# Patient Record
Sex: Male | Born: 1965 | Race: Black or African American | Hispanic: No | Marital: Married | Smoking: Never smoker
Health system: Southern US, Community
[De-identification: ages and names within clinical notes are randomized; demographics above are authoritative.]

## PROBLEM LIST (undated history)

## (undated) DIAGNOSIS — E0500 Thyrotoxicosis with diffuse goiter without thyrotoxic crisis or storm: Secondary | ICD-10-CM

## (undated) DIAGNOSIS — E039 Hypothyroidism, unspecified: Secondary | ICD-10-CM

## (undated) DIAGNOSIS — I10 Essential (primary) hypertension: Secondary | ICD-10-CM

## (undated) DIAGNOSIS — J939 Pneumothorax, unspecified: Secondary | ICD-10-CM

## (undated) DIAGNOSIS — E785 Hyperlipidemia, unspecified: Secondary | ICD-10-CM

## (undated) HISTORY — DX: Hypothyroidism, unspecified: E03.9

## (undated) HISTORY — DX: Essential (primary) hypertension: I10

## (undated) HISTORY — DX: Pneumothorax, unspecified: J93.9

## (undated) HISTORY — DX: Thyrotoxicosis with diffuse goiter without thyrotoxic crisis or storm: E05.00

## (undated) HISTORY — PX: COLONOSCOPY WITH PROPOFOL: SHX5780

## (undated) HISTORY — DX: Hyperlipidemia, unspecified: E78.5

---

## 1995-07-02 DIAGNOSIS — J939 Pneumothorax, unspecified: Secondary | ICD-10-CM | POA: Insufficient documentation

## 1995-07-02 HISTORY — DX: Pneumothorax, unspecified: J93.9

## 2005-06-06 ENCOUNTER — Emergency Department: Payer: Self-pay | Admitting: Emergency Medicine

## 2005-06-25 ENCOUNTER — Emergency Department: Payer: Self-pay | Admitting: Emergency Medicine

## 2006-11-08 ENCOUNTER — Other Ambulatory Visit: Payer: Self-pay

## 2006-11-09 ENCOUNTER — Inpatient Hospital Stay: Payer: Self-pay | Admitting: Internal Medicine

## 2008-01-04 ENCOUNTER — Emergency Department: Payer: Self-pay | Admitting: Emergency Medicine

## 2011-01-27 ENCOUNTER — Emergency Department: Payer: Self-pay | Admitting: Emergency Medicine

## 2013-03-05 LAB — HM HEPATITIS C SCREENING LAB: HM Hepatitis Screen: NEGATIVE

## 2015-02-06 ENCOUNTER — Other Ambulatory Visit: Payer: Self-pay | Admitting: Family Medicine

## 2015-02-06 DIAGNOSIS — E039 Hypothyroidism, unspecified: Secondary | ICD-10-CM

## 2015-03-02 ENCOUNTER — Other Ambulatory Visit: Payer: Self-pay | Admitting: Family Medicine

## 2015-07-14 ENCOUNTER — Other Ambulatory Visit: Payer: Self-pay | Admitting: Family Medicine

## 2015-08-14 ENCOUNTER — Other Ambulatory Visit: Payer: Self-pay | Admitting: Family Medicine

## 2015-09-12 ENCOUNTER — Other Ambulatory Visit: Payer: Self-pay | Admitting: Family Medicine

## 2015-09-29 ENCOUNTER — Other Ambulatory Visit: Payer: Self-pay | Admitting: Family Medicine

## 2015-12-27 ENCOUNTER — Other Ambulatory Visit: Payer: Self-pay | Admitting: Family Medicine

## 2016-01-18 ENCOUNTER — Other Ambulatory Visit: Payer: Self-pay | Admitting: Family Medicine

## 2016-04-01 ENCOUNTER — Other Ambulatory Visit: Payer: Self-pay | Admitting: Family Medicine

## 2016-05-24 ENCOUNTER — Other Ambulatory Visit: Payer: Self-pay | Admitting: Family Medicine

## 2016-06-25 ENCOUNTER — Other Ambulatory Visit: Payer: Self-pay

## 2016-06-25 MED ORDER — LEVOTHYROXINE SODIUM 150 MCG PO TABS: 150.0000 ug | ORAL_TABLET | Freq: Every day | ORAL | 1 refills | Status: DC

## 2016-06-25 NOTE — Telephone Encounter (Signed)
Pharmacy is requesting permission to change manufacture. Please review. [employer] employee. Thank you. sd

## 2016-10-05 ENCOUNTER — Other Ambulatory Visit: Payer: Self-pay | Admitting: Family Medicine

## 2016-10-07 ENCOUNTER — Other Ambulatory Visit: Payer: Self-pay

## 2016-10-07 NOTE — Telephone Encounter (Signed)
[facility] pharmacy is requesting a refill on lisinopril (PRINIVIL,ZESTRIL) 10 MG tablet

## 2017-03-19 ENCOUNTER — Other Ambulatory Visit: Payer: Self-pay | Admitting: Family Medicine

## 2017-06-05 ENCOUNTER — Encounter: Payer: Self-pay | Admitting: Family Medicine

## 2017-11-03 DIAGNOSIS — E039 Hypothyroidism, unspecified: Secondary | ICD-10-CM

## 2017-11-03 DIAGNOSIS — E890 Postprocedural hypothyroidism: Secondary | ICD-10-CM | POA: Insufficient documentation

## 2017-11-03 DIAGNOSIS — E0500 Thyrotoxicosis with diffuse goiter without thyrotoxic crisis or storm: Secondary | ICD-10-CM | POA: Insufficient documentation

## 2017-11-03 DIAGNOSIS — E785 Hyperlipidemia, unspecified: Secondary | ICD-10-CM | POA: Insufficient documentation

## 2017-11-03 DIAGNOSIS — I10 Essential (primary) hypertension: Secondary | ICD-10-CM | POA: Insufficient documentation

## 2017-11-03 HISTORY — DX: Essential (primary) hypertension: I10

## 2017-11-03 HISTORY — DX: Hypothyroidism, unspecified: E03.9

## 2017-11-03 HISTORY — DX: Hyperlipidemia, unspecified: E78.5

## 2017-11-03 HISTORY — DX: Thyrotoxicosis with diffuse goiter without thyrotoxic crisis or storm: E05.00

## 2017-11-04 ENCOUNTER — Ambulatory Visit (INDEPENDENT_AMBULATORY_CARE_PROVIDER_SITE_OTHER): Payer: No Typology Code available for payment source | Admitting: Family Medicine

## 2017-11-04 ENCOUNTER — Encounter: Payer: Self-pay | Admitting: Family Medicine

## 2017-11-04 VITALS — BP 132/88 | HR 74 | Temp 98.4°F | Ht 73.0 in | Wt 229.6 lb

## 2017-11-04 DIAGNOSIS — Z0000 Encounter for general adult medical examination without abnormal findings: Secondary | ICD-10-CM

## 2017-11-04 DIAGNOSIS — I10 Essential (primary) hypertension: Secondary | ICD-10-CM | POA: Diagnosis not present

## 2017-11-04 DIAGNOSIS — Z114 Encounter for screening for human immunodeficiency virus [HIV]: Secondary | ICD-10-CM

## 2017-11-04 DIAGNOSIS — Z125 Encounter for screening for malignant neoplasm of prostate: Secondary | ICD-10-CM

## 2017-11-04 DIAGNOSIS — Z5320 Procedure and treatment not carried out because of patient's decision for unspecified reasons: Secondary | ICD-10-CM | POA: Diagnosis not present

## 2017-11-04 DIAGNOSIS — E039 Hypothyroidism, unspecified: Secondary | ICD-10-CM

## 2017-11-04 DIAGNOSIS — E782 Mixed hyperlipidemia: Secondary | ICD-10-CM

## 2017-11-04 LAB — HEMOCCULT GUIAC POC 1CARD (OFFICE): FECAL OCCULT BLD: NEGATIVE

## 2017-11-04 NOTE — Progress Notes (Signed)
Patient: Scott Holloway, Male    DOB: 1966-05-28, 52 y.o.   MRN: [id number] Visit Date: 11/04/2017  Today's Provider: Dortha Kern, PA   Chief Complaint  Patient presents with  . Annual Exam   Subjective:    Annual physical exam Scott Holloway is a 52 y.o. male who presents today for health maintenance and complete physical. He feels well. He reports exercising 3 days per week. He reports he is sleeping fair. He works 3rd shift.  -----------------------------------------------------------------   Review of Systems  Constitutional: Negative.   HENT: Negative.   Eyes: Negative.   Respiratory: Negative.   Cardiovascular: Negative.   Gastrointestinal: Negative.   Endocrine: Negative.   Genitourinary: Negative.   Musculoskeletal: Negative.   Skin: Negative.   Allergic/Immunologic: Negative.   Neurological: Negative.   Hematological: Negative.   Psychiatric/Behavioral: Negative.    Social History      He  reports that he has never smoked. He has never used smokeless tobacco. He reports that he does not drink alcohol or use drugs.       Social History   Socioeconomic History  . Marital status: Married    Spouse name: Not on file  . Number of children: Not on file  . Years of education: Not on file  . Highest education level: Not on file  Occupational History  . Not on file  Social Needs  . Financial resource strain: Not on file  . Food insecurity:    Worry: Not on file    Inability: Not on file  . Transportation needs:    Medical: Not on file    Non-medical: Not on file  Tobacco Use  . Smoking status: Never Smoker  . Smokeless tobacco: Never Used  Substance and Sexual Activity  . Alcohol use: Never    Frequency: Never  . Drug use: Never  . Sexual activity: Yes  Lifestyle  . Physical activity:    Days per week: Not on file    Minutes per session: Not on file  . Stress: Not on file  Relationships  . Social connections:    Talks on phone: Not on  file    Gets together: Not on file    Attends religious service: Not on file    Active member of club or organization: Not on file    Attends meetings of clubs or organizations: Not on file    Relationship status: Not on file  Other Topics Concern  . Not on file  Social History Narrative  . Not on file    Past Medical History:  Diagnosis Date  . Graves disease 11/03/2017  . Hyperlipidemia 11/03/2017  . Hypertension 11/03/2017  . Hypothyroidism 11/03/2017  . Pneumothorax 1997   Patient Active Problem List   Diagnosis Date Noted  . Hyperlipidemia 11/03/2017  . Hypertension 11/03/2017  . Hypothyroidism (acquired) 11/03/2017  . Graves disease 11/03/2017  . Pneumothorax 07/02/1995   History reviewed. No pertinent surgical history.  Family History        Family Status  Relation Name Status  . Mother  Alive  . Father  Deceased  . Sister  Alive  . Brother  Alive       step brother  . Son  Alive  . Daughter  Alive  . MGM  Deceased  . MGF  Deceased  . PGM  Deceased  . PGF  Deceased       accidental death-froze to death  His family history includes Alzheimer's disease in his paternal grandmother; Aneurysm in his maternal grandfather and mother; Healthy in his daughter, sister, and son; Hypertension in his brother, maternal grandmother, and mother; Lung cancer in his father.     No Known Allergies  Current Outpatient Medications:  .  levothyroxine (SYNTHROID, LEVOTHROID) 150 MCG tablet, TAKE ONE TABLET BY MOUTH ONCE DAILY, Disp: 90 tablet, Rfl: 1 .  lisinopril (PRINIVIL,ZESTRIL) 10 MG tablet, TAKE 1 TABLET BY MOUTH ONCE DAILY, Disp: 90 tablet, Rfl: 1 .  Omega-3 Fatty Acids (FISH OIL) 1000 MG CAPS, Take by mouth., Disp: , Rfl:  .  Red Yeast Rice Extract (RED YEAST RICE PO), Take by mouth., Disp: , Rfl:    Patient Care Team: Aadya Kindler, Jodell Cipro, PA as PCP - General (Family Medicine)      Objective:   Vitals: BP 132/88 (BP Location: Left Arm, Patient Position: Sitting,  Cuff Size: Normal)   Pulse 74   Temp 98.4 F (36.9 C) (Oral)   Ht  (1.854 m)   Wt 229 lb 9.6 oz (104.1 kg)   SpO2 98%   BMI 30.29 kg/m   Physical Exam  Constitutional: He is oriented to person, place, and time. He appears well-developed and well-nourished.  HENT:  Head: Normocephalic and atraumatic.  Right Ear: External ear normal.  Left Ear: External ear normal.  Nose: Nose normal.  Mouth/Throat: Oropharynx is clear and moist.  Eyes: Pupils are equal, round, and reactive to light. Conjunctivae and EOM are normal. Right eye exhibits no discharge.  Neck: Normal range of motion. Neck supple. No tracheal deviation present. No thyromegaly present.  Cardiovascular: Normal rate, regular rhythm, normal heart sounds and intact distal pulses.  No murmur heard. Pulmonary/Chest: Effort normal and breath sounds normal. No respiratory distress. He has no wheezes. He has no rales. He exhibits no tenderness.  Abdominal: Soft. He exhibits no distension and no mass. There is no tenderness. There is no rebound and no guarding.  Genitourinary: Rectum normal, prostate normal and penis normal. Rectal exam shows guaiac negative stool.  Genitourinary Comments: Normal uncircumcised male.  Musculoskeletal: Normal range of motion. He exhibits no edema or tenderness.  Lymphadenopathy:    He has no cervical adenopathy.  Neurological: He is alert and oriented to person, place, and time. He has normal reflexes. He displays normal reflexes. No cranial nerve deficit. He exhibits normal muscle tone. Coordination normal.  Skin: Skin is warm and dry. No rash noted. No erythema.  < 1 cm lipomas - one on the right forearm and one on the lower abdomen to the right of the umbilicus.  Psychiatric: He has a normal mood and affect. His behavior is normal. Judgment and thought content normal.    Depression Screen PHQ 2/9 Scores 11/04/2017  PHQ - 2 Score 0  PHQ- 9 Score 1    Assessment & Plan:     Routine Health  Maintenance and Physical Exam  Exercise Activities and Dietary recommendations Goals    None       There is no immunization history on file for this patient.  Health Maintenance  Topic Date Due  . HIV Screening  08/28/1978  . TETANUS/TDAP  08/28/1982  . COLONOSCOPY  08/28/2013  . INFLUENZA VACCINE  01/29/2018  . Hepatitis C Screening  Completed    Discussed health benefits of physical activity, and encouraged him to engage in regular exercise appropriate for his age and condition.    --------------------------------------------------------------------  1. Annual physical exam Good  general health. Has changed to a 3 shift job the past 3-4 months and trying to adjust. Last colonoscopy by Dr. Thurmond Butts was more than 13 years ago. Denies hematochezia, melena or history of polyps. Refuses repeat colonoscopy - hemoccult negative for blood in stools. Will get record of last tetanus given 2-3 years ago at [facility]. Given anticipatory counseling and will get routine labs. Follow up pending reports. - CBC with Differential - Lipid Profile - TSH - Comprehensive metabolic panel - PSA  2. Essential hypertension Fair control with Lisinopril 10 mg qd. No chest pains, palpitations, dyspnea or edema. Recheck labs and continue present dosage. - CBC with Differential - Lipid Profile - Comprehensive metabolic panel  3. Hypothyroidism (acquired) History of Grave's Disease without exophthalmos, tremors or palpitations. Still taking the Levothyroxine 150 mcg qd. Recheck CBC, T4 and TSH. Follow up pending reports. - CBC with Differential - TSH - T4  4. Mixed hyperlipidemia Still taking the Omega-3 and Red Yeast Rice Extract. Recheck lipid panel and CMP. - Omega-3 Fatty Acids (FISH OIL) 1000 MG CAPS; Take by mouth. - Red Yeast Rice Extract (RED YEAST RICE PO); Take by mouth. - Lipid Profile - Comprehensive metabolic panel  5. Screening for HIV (human immunodeficiency virus) - HIV  antibody  6. Screening PSA (prostate specific antigen) No nocturia. Some dribbling after urinating. No significant decrease in stream or hesitancy. Will check PSA. DRE essentially normal. - PSA   Dortha Kern, PA  [facility]

## 2017-11-05 LAB — TSH: TSH: 3.12 u[IU]/mL (ref 0.450–4.500)

## 2017-11-05 LAB — LIPID PANEL
CHOL/HDL RATIO: 5.9 ratio — AB (ref 0.0–5.0)
Cholesterol, Total: 291 mg/dL — ABNORMAL HIGH (ref 100–199)
HDL: 49 mg/dL (ref >39)
LDL Calculated: 215 mg/dL — ABNORMAL HIGH (ref 0–99)
Triglycerides: 134 mg/dL (ref 0–149)
VLDL Cholesterol Cal: 27 mg/dL (ref 5–40)

## 2017-11-05 LAB — COMPREHENSIVE METABOLIC PANEL
ALK PHOS: 54 IU/L (ref 39–117)
ALT: 13 IU/L (ref 0–44)
AST: 21 IU/L (ref 0–40)
Albumin/Globulin Ratio: 1.4 (ref 1.2–2.2)
Albumin: 4 g/dL (ref 3.5–5.5)
BUN/Creatinine Ratio: 10 (ref 9–20)
BUN: 14 mg/dL (ref 6–24)
Bilirubin Total: 0.2 mg/dL (ref 0.0–1.2)
CALCIUM: 9.2 mg/dL (ref 8.7–10.2)
CO2: 24 mmol/L (ref 20–29)
CREATININE: 1.34 mg/dL — AB (ref 0.76–1.27)
Chloride: 103 mmol/L (ref 96–106)
GFR calc Af Amer: 69 mL/min/{1.73_m2} (ref >59)
GFR calc non Af Amer: 60 mL/min/{1.73_m2} (ref >59)
Globulin, Total: 2.9 g/dL (ref 1.5–4.5)
Glucose: 92 mg/dL (ref 65–99)
Potassium: 4.1 mmol/L (ref 3.5–5.2)
SODIUM: 142 mmol/L (ref 134–144)
Total Protein: 6.9 g/dL (ref 6.0–8.5)

## 2017-11-05 LAB — CBC WITH DIFFERENTIAL/PLATELET
BASOS ABS: 0 10*3/uL (ref 0.0–0.2)
BASOS: 0 %
EOS (ABSOLUTE): 0.2 10*3/uL (ref 0.0–0.4)
Eos: 2 %
Hematocrit: 40.3 % (ref 37.5–51.0)
Hemoglobin: 13.2 g/dL (ref 13.0–17.7)
Immature Grans (Abs): 0 10*3/uL (ref 0.0–0.1)
Immature Granulocytes: 0 %
LYMPHS: 33 %
Lymphocytes Absolute: 2.6 10*3/uL (ref 0.7–3.1)
MCH: 28.9 pg (ref 26.6–33.0)
MCHC: 32.8 g/dL (ref 31.5–35.7)
MCV: 88 fL (ref 79–97)
MONOS ABS: 0.6 10*3/uL (ref 0.1–0.9)
Monocytes: 7 %
NEUTROS ABS: 4.5 10*3/uL (ref 1.4–7.0)
NEUTROS PCT: 58 %
PLATELETS: 276 10*3/uL (ref 150–379)
RBC: 4.56 x10E6/uL (ref 4.14–5.80)
RDW: 13.9 % (ref 12.3–15.4)
WBC: 7.9 10*3/uL (ref 3.4–10.8)

## 2017-11-05 LAB — HIV ANTIBODY (ROUTINE TESTING W REFLEX): HIV SCREEN 4TH GENERATION: NONREACTIVE

## 2017-11-05 LAB — T4: T4 TOTAL: 9.5 ug/dL (ref 4.5–12.0)

## 2017-11-05 LAB — PSA: PROSTATE SPECIFIC AG, SERUM: 9.3 ng/mL — AB (ref 0.0–4.0)

## 2017-11-06 ENCOUNTER — Telehealth: Payer: Self-pay | Admitting: Family Medicine

## 2017-11-06 ENCOUNTER — Telehealth: Payer: Self-pay

## 2017-11-06 DIAGNOSIS — R972 Elevated prostate specific antigen [PSA]: Secondary | ICD-10-CM

## 2017-11-06 DIAGNOSIS — E782 Mixed hyperlipidemia: Secondary | ICD-10-CM

## 2017-11-06 MED ORDER — PRAVASTATIN SODIUM 20 MG PO TABS: 20.0000 mg | ORAL_TABLET | Freq: Every day | ORAL | 3 refills | Status: DC

## 2017-11-06 NOTE — Telephone Encounter (Signed)
Please review. Thanks!  

## 2017-11-06 NOTE — Telephone Encounter (Signed)
Called patient and discussed adding Pravastatin while discontinuing the Red Yeast Rice. Also, will continue Omega-3 supplement and counseled regarding need for urology referral with PSA more than twice the upper limits of normal. Already had an appointment with an urologist.

## 2017-11-06 NOTE — Telephone Encounter (Signed)
Not the Red Yeast Rice, but, continue the Omega-3.

## 2017-11-06 NOTE — Telephone Encounter (Signed)
Patient advised. RX sent to [facility]. Urology referral placed. 3 month follow up scheduled.

## 2017-11-06 NOTE — Telephone Encounter (Signed)
Pt wants to know if you want him to continue taking the red yeast rice and omega 3.Please advise

## 2017-11-06 NOTE — Telephone Encounter (Signed)
Pt spoke with Nicki earlier today about results.  He has a couple other questions.  One is he wants to know since Maurine Minister put his on chol. meds does he want him to continue taking the Red Yeast Rice Supplement.  He also ask about referral to urology and exactly why he is going.  His call back [phone]  Thanks teri

## 2017-11-06 NOTE — Telephone Encounter (Signed)
-----   Message from Tamsen Roers, [place] sent at 11/06/2017  9:17 AM EDT ----- LDL cholesterol high with risk ration elevated. Needs Pravastatin 20 mg qd #30 & 3RF with follow up appointment in 3 months to check progress. Increase in creatinine indicates kidney strain with PSA twice the upper limits of normal. Need referral to urologist to evaluate and rule out prostate disease/cancer.

## 2017-12-15 ENCOUNTER — Encounter: Payer: Self-pay | Admitting: Urology

## 2017-12-15 ENCOUNTER — Ambulatory Visit (INDEPENDENT_AMBULATORY_CARE_PROVIDER_SITE_OTHER): Payer: No Typology Code available for payment source | Admitting: Urology

## 2017-12-15 VITALS — BP 122/80 | HR 69 | Resp 16 | Ht 72.0 in | Wt 229.2 lb

## 2017-12-15 DIAGNOSIS — R972 Elevated prostate specific antigen [PSA]: Secondary | ICD-10-CM | POA: Diagnosis not present

## 2017-12-15 MED ORDER — TAMSULOSIN HCL 0.4 MG PO CAPS: 0.4000 mg | ORAL_CAPSULE | Freq: Every day | ORAL | 0 refills | Status: DC

## 2017-12-15 NOTE — Progress Notes (Signed)
12/15/2017 3:01 PM   Scott Holloway February 06, 1966 [id number]  Referring provider: Tamsen Holloway, Scott E, PA [address] [place], [place] [id number]  Chief Complaint  Patient presents with  . Elevated PSA    HPI: Scott Holloway is a 52 year old male seen in consultation at the request of Scott Holloway for evaluation of an elevated PSA.  A PSA drawn 11/04/2017 was elevated at 9.3.  He states he thinks his PSA was checked annually for several years when he worked at [employer] and he thinks it was always normal.  He denies previous urologic evaluation for an elevated PSA.  There is no history of prostate cancer in first-degree relatives.  His only lower urinary tract symptoms occasional post void dribbling.  He denies dysuria or gross hematuria.  He denies flank, abdominal, pelvic or scrotal pain.   PMH: Past Medical History:  Diagnosis Date  . Graves disease 11/03/2017  . Hyperlipidemia 11/03/2017  . Hypertension 11/03/2017  . Hypothyroidism 11/03/2017  . Pneumothorax 1997    Surgical History: History reviewed. No pertinent surgical history.  Home Medications:  Allergies as of 12/15/2017   No Known Allergies     Medication List        Accurate as of 12/15/17  3:01 PM. Always use your most recent med list.          Fish Oil 1000 MG Caps Take by mouth.   levothyroxine 150 MCG tablet Commonly known as:  SYNTHROID, LEVOTHROID TAKE ONE TABLET BY MOUTH ONCE DAILY   lisinopril 10 MG tablet Commonly known as:  PRINIVIL,ZESTRIL TAKE 1 TABLET BY MOUTH ONCE DAILY   pravastatin 20 MG tablet Commonly known as:  PRAVACHOL Take 1 tablet (20 mg total) by mouth daily.   RED YEAST RICE PO Take by mouth.       Allergies: No Known Allergies  Family History: Family History  Problem Relation Age of Onset  . Hypertension Mother   . Aneurysm Mother   . Heart attack Mother   . Lung cancer Father   . Healthy Sister   . Hypertension Brother   . Healthy Son   . Healthy Daughter    . Hypertension Maternal Grandmother   . Aneurysm Maternal Grandfather   . Alzheimer's disease Paternal Grandmother     Social History:  reports that he has never smoked. He has never used smokeless tobacco. He reports that he does not drink alcohol or use drugs.  ROS: UROLOGY Frequent Urination?: Yes Hard to postpone urination?: No Burning/pain with urination?: No Get up at night to urinate?: Yes Leakage of urine?: Yes Urine stream starts and stops?: No Trouble starting stream?: No Do you have to strain to urinate?: No Blood in urine?: No Urinary tract infection?: No Sexually transmitted disease?: No Injury to kidneys or bladder?: No Painful intercourse?: No Weak stream?: No Erection problems?: Yes Penile pain?: No  Gastrointestinal Nausea?: No Vomiting?: No Indigestion/heartburn?: No Diarrhea?: No Constipation?: No  Constitutional Fever: No Night sweats?: No Weight loss?: No Fatigue?: No  Skin Skin rash/lesions?: No Itching?: No  Eyes Blurred vision?: No Double vision?: No  Ears/Nose/Throat Sore throat?: No Sinus problems?: Yes  Hematologic/Lymphatic Swollen glands?: No Easy bruising?: No  Cardiovascular Leg swelling?: No Chest pain?: No  Respiratory Cough?: Yes Shortness of breath?: No  Endocrine Excessive thirst?: No  Musculoskeletal Back pain?: Yes Joint pain?: Yes  Neurological Headaches?: No Dizziness?: No  Psychologic Depression?: No Anxiety?: No  Physical Exam: BP 122/80   Pulse 69  Resp 16   Ht 6' (1.829 m)   Wt 229 lb 3.2 oz (104 kg)   SpO2 96%   BMI 31.09 kg/m   Constitutional:  Alert and oriented, No acute distress. HEENT: [place] AT, moist mucus membranes.  Trachea midline, no masses. Cardiovascular: No clubbing, cyanosis, or edema. Respiratory: Normal respiratory effort, no increased work of breathing. GI: Abdomen is soft, nontender, nondistended, no abdominal masses GU: No CVA tenderness.  Prostate 40 g, smooth  without nodules.  He has significant discomfort in the anal canal on DRE. Lymph: No cervical or inguinal lymphadenopathy. Skin: No rashes, bruises or suspicious lesions. Neurologic: Grossly intact, no focal deficits, moving all 4 extremities. Psychiatric: Normal mood and affect.   Assessment & Plan:   52 year old male with an elevated PSA and benign DRE. Although PSA is a prostate cancer screening test he was informed that cancer is not the most common cause of an elevated PSA. Other potential causes including BPH and inflammation were discussed. He was informed that the only way to adequately diagnose prostate cancer would be a transrectal ultrasound and biopsy of the prostate. The procedure was discussed including potential risks of bleeding and infection/sepsis. He was also informed that a negative biopsy does not conclusively rule out the possibility that prostate cancer may be present and that continued monitoring is required. The use of newer adjunctive blood tests including PHI and 4kScore were discussed. The use of multiparametric prostate MRI was also discussed however is not typically used for initial evaluation of an elevated PSA. Continued periodic surveillance was also discussed.  He has not had a repeat PSA and have recommended a follow-up PSA in approximately 1 month after a 30-day alpha-blocker course.  He will be notified with the results and further recommendations.   [name], MD  [facility] [address], [address] [place], [place] [address] [phone]

## 2018-01-06 ENCOUNTER — Other Ambulatory Visit: Payer: Self-pay | Admitting: Family Medicine

## 2018-01-06 MED ORDER — LEVOTHYROXINE SODIUM 150 MCG PO TABS: 150.0000 ug | ORAL_TABLET | Freq: Every day | ORAL | 1 refills | Status: DC

## 2018-01-06 MED ORDER — LISINOPRIL 10 MG PO TABS: 10.0000 mg | ORAL_TABLET | Freq: Every day | ORAL | 1 refills | Status: DC

## 2018-01-06 NOTE — Telephone Encounter (Signed)
[facility] pharmacy faxed a refill request for the following medication. Thanks CC  levothyroxine (SYNTHROID, LEVOTHROID) 150 MCG tablet   lisinopril (PRINIVIL,ZESTRIL) 10 MG tablet

## 2018-01-14 ENCOUNTER — Other Ambulatory Visit: Payer: No Typology Code available for payment source

## 2018-01-14 DIAGNOSIS — R972 Elevated prostate specific antigen [PSA]: Secondary | ICD-10-CM

## 2018-01-15 LAB — PSA: Prostate Specific Ag, Serum: 2.5 ng/mL (ref 0.0–4.0)

## 2018-01-16 ENCOUNTER — Telehealth: Payer: Self-pay | Admitting: Urology

## 2018-01-16 NOTE — Telephone Encounter (Signed)
Pt called asking about lab results.  Please call pt

## 2018-01-19 ENCOUNTER — Telehealth: Payer: Self-pay | Admitting: Urology

## 2018-01-19 NOTE — Telephone Encounter (Signed)
Pt informed

## 2018-01-19 NOTE — Telephone Encounter (Signed)
-----   Message from Riki AltesScott C Stoioff, MD sent at 01/17/2018  1:43 PM EDT ----- Repeat PSA normal at 2.5.  Recommend continuing annual PSA with his primary provider

## 2018-02-06 ENCOUNTER — Ambulatory Visit: Payer: No Typology Code available for payment source | Admitting: Family Medicine

## 2018-03-03 ENCOUNTER — Encounter: Payer: Self-pay | Admitting: Family Medicine

## 2018-03-03 ENCOUNTER — Ambulatory Visit (INDEPENDENT_AMBULATORY_CARE_PROVIDER_SITE_OTHER): Payer: No Typology Code available for payment source | Admitting: Family Medicine

## 2018-03-03 VITALS — BP 138/86 | HR 78 | Temp 99.0°F | Wt 230.0 lb

## 2018-03-03 DIAGNOSIS — I10 Essential (primary) hypertension: Secondary | ICD-10-CM

## 2018-03-03 DIAGNOSIS — E782 Mixed hyperlipidemia: Secondary | ICD-10-CM

## 2018-03-03 MED ORDER — PRAVASTATIN SODIUM 20 MG PO TABS: 20.0000 mg | ORAL_TABLET | Freq: Every day | ORAL | 3 refills | Status: DC

## 2018-03-03 NOTE — Progress Notes (Signed)
Patient: Scott Holloway Male    DOB: 1966-04-07   52 y.o.   MRN: [id number] Visit Date: 03/03/2018  Today's Provider: Dortha Kern, PA   Chief Complaint  Patient presents with  . Hyperlipidemia   Subjective:      Hyperlipidemia  The problem is uncontrolled. Recent lipid tests were reviewed and are high. Current antihyperlipidemic treatment includes statins and herbal therapy. The current treatment provides moderate improvement of lipids. There are no compliance problems.    Pt reports he had cholesterol rechecked about a month ago at work.  He states his cholesterol has improved on the medicine.    Lab Results  Component Value Date   CHOL 291 (H) 11/04/2017   Lab Results  Component Value Date   HDL 49 11/04/2017   Lab Results  Component Value Date   LDLCALC 215 (H) 11/04/2017   Lab Results  Component Value Date   TRIG 134 11/04/2017   Lab Results  Component Value Date   CHOLHDL 5.9 (H) 11/04/2017   No results found for: LDLDIRECT Wt Readings from Last 3 Encounters:  03/03/18 230 lb (104.3 kg)  12/15/17 229 lb 3.2 oz (104 kg)  11/04/17 229 lb 9.6 oz (104.1 kg)      Past Medical History:  Diagnosis Date  . Graves disease 11/03/2017  . Hyperlipidemia 11/03/2017  . Hypertension 11/03/2017  . Hypothyroidism 11/03/2017  . Pneumothorax 1997   No past surgical history on file. Family History  Problem Relation Age of Onset  . Hypertension Mother   . Aneurysm Mother   . Heart attack Mother   . Lung cancer Father   . Healthy Sister   . Hypertension Brother   . Healthy Son   . Healthy Daughter   . Hypertension Maternal Grandmother   . Aneurysm Maternal Grandfather   . Alzheimer's disease Paternal Grandmother    No Known Allergies  Current Outpatient Medications:  .  levothyroxine (SYNTHROID, LEVOTHROID) 150 MCG tablet, Take 1 tablet (150 mcg total) by mouth daily., Disp: 90 tablet, Rfl: 1 .  lisinopril (PRINIVIL,ZESTRIL) 10 MG tablet, Take 1 tablet (10 mg  total) by mouth daily., Disp: 90 tablet, Rfl: 1 .  Omega-3 Fatty Acids (FISH OIL) 1000 MG CAPS, Take by mouth., Disp: , Rfl:  .  pravastatin (PRAVACHOL) 20 MG tablet, Take 1 tablet (20 mg total) by mouth daily., Disp: 30 tablet, Rfl: 3 .  Red Yeast Rice Extract (RED YEAST RICE PO), Take by mouth., Disp: , Rfl:  .  tamsulosin (FLOMAX) 0.4 MG CAPS capsule, Take 1 capsule (0.4 mg total) by mouth daily. (Patient not taking: Reported on 03/03/2018), Disp: 30 capsule, Rfl: 0  Review of Systems  Constitutional: Negative.   Respiratory: Negative.   Cardiovascular: Negative.   Gastrointestinal: Negative.   Musculoskeletal: Negative.   Neurological: Negative for dizziness, light-headedness and headaches.   Social History   Tobacco Use  . Smoking status: Never Smoker  . Smokeless tobacco: Never Used  Substance Use Topics  . Alcohol use: Never    Frequency: Never   Objective:   BP 138/86 (BP Location: Right Arm, Patient Position: Sitting, Cuff Size: Large)   Pulse 78   Temp 99 F (37.2 C) (Oral)   Wt 230 lb (104.3 kg)   SpO2 96%   BMI 31.19 kg/m  Vitals:   03/03/18 0925  BP: 138/86  Pulse: 78  Temp: 99 F (37.2 C)  TempSrc: Oral  SpO2: 96%  Weight: 230 lb (  104.3 kg)   Physical Exam  Constitutional: He is oriented to person, place, and time. He appears well-developed and well-nourished. No distress.  HENT:  Head: Normocephalic and atraumatic.  Right Ear: Hearing normal.  Left Ear: Hearing normal.  Nose: Nose normal.  Eyes: Conjunctivae and lids are normal. Right eye exhibits no discharge. Left eye exhibits no discharge. No scleral icterus.  Neck: Neck supple. No thyromegaly present.  Cardiovascular: Normal rate and regular rhythm.  Pulmonary/Chest: Effort normal and breath sounds normal. No respiratory distress.  Abdominal: Soft. Bowel sounds are normal.  Musculoskeletal: Normal range of motion.  Neurological: He is alert and oriented to person, place, and time.  Skin: Skin  is intact. No lesion and no rash noted.  Psychiatric: He has a normal mood and affect. His speech is normal and behavior is normal. Thought content normal.      Assessment & Plan:     1. Mixed hyperlipidemia Tolerating the Pravastatin 20 mg qd with low fat diet. Needs to lose weight with BMI 31+. Recommend exercise 30-40 minutes 4-5 times a day. Had cholesterol rechecked at work and will get a copy of the report for review. Recheck pending report. - pravastatin (PRAVACHOL) 20 MG tablet; Take 1 tablet (20 mg total) by mouth daily.  Dispense: 90 tablet; Refill: 3  2. Essential hypertension Tolerating the Lisinopril 10 mg qd without cough or signs of angioedema. Continue to work on salt restriction and diet for weight control.       Dortha Kern, PA  [facility]

## 2018-08-16 ENCOUNTER — Other Ambulatory Visit: Payer: Self-pay | Admitting: Family Medicine

## 2018-10-07 ENCOUNTER — Other Ambulatory Visit: Payer: Self-pay

## 2018-10-07 ENCOUNTER — Emergency Department
Admission: EM | Admit: 2018-10-07 | Discharge: 2018-10-07 | Disposition: A | Discharge status: Home / Self Care | Payer: No Typology Code available for payment source | Attending: Emergency Medicine | Admitting: Emergency Medicine

## 2018-10-07 ENCOUNTER — Encounter: Payer: Self-pay | Admitting: Emergency Medicine

## 2018-10-07 DIAGNOSIS — E039 Hypothyroidism, unspecified: Secondary | ICD-10-CM | POA: Insufficient documentation

## 2018-10-07 DIAGNOSIS — Z79899 Other long term (current) drug therapy: Secondary | ICD-10-CM | POA: Insufficient documentation

## 2018-10-07 DIAGNOSIS — I10 Essential (primary) hypertension: Secondary | ICD-10-CM | POA: Insufficient documentation

## 2018-10-07 DIAGNOSIS — K0889 Other specified disorders of teeth and supporting structures: Secondary | ICD-10-CM | POA: Diagnosis present

## 2018-10-07 MED ORDER — LIDOCAINE-EPINEPHRINE 2 %-1:100000 IJ SOLN
1.7000 mL | Freq: Once | INTRAMUSCULAR | Status: AC
  Administered 2018-10-07: 1.7 mL
  Filled 2018-10-07: qty 1.7

## 2018-10-07 MED ORDER — HYDROCODONE-ACETAMINOPHEN 5-325 MG PO TABS: 1.0000 | ORAL_TABLET | ORAL | 0 refills | Status: DC | PRN

## 2018-10-07 MED ORDER — MAGIC MOUTHWASH W/LIDOCAINE: 5.0000 mL | Freq: Four times a day (QID) | ORAL | 0 refills | Status: DC

## 2018-10-07 NOTE — ED Provider Notes (Signed)
[facility] Emergency Department Provider Note  ____________________________________________  Time seen: Approximately 1:23 PM  I have reviewed the triage vital signs and the nursing notes.   HISTORY  Chief Complaint Dental Pain    HPI Scott Holloway is a 53 y.o. male who presents the emergency department complaining of right-sided dental pain.  Patient states that last night he was eating some hot food (temperature hot).  Patient reports that he started to experience a throbbing sensation in the right dentition.  He is unsure whether this is the upper or lower dentition, stating that it is the whole right side of my face.  Patient denies any dental trauma.  The food was soft in nature.  He denies any history of recurrent dental infections.  Patient has tried multiple over-the-counter medications to include Tylenol and Motrin, BC powder to the area, and a oral numbing solution.  Patient denies any visible edema in the inside or outer portion of the mouth or jaw.  No fevers or chills.  No difficulty breathing or swallowing.  Patient denies any other complaints at this time.         Past Medical History:  Diagnosis Date  . Graves disease 11/03/2017  . Hyperlipidemia 11/03/2017  . Hypertension 11/03/2017  . Hypothyroidism 11/03/2017  . Pneumothorax 1997    Patient Active Problem List   Diagnosis Date Noted  . Elevated PSA 12/15/2017  . Hyperlipidemia 11/03/2017  . Hypertension 11/03/2017  . Hypothyroidism (acquired) 11/03/2017  . Graves disease 11/03/2017  . Pneumothorax 07/02/1995    History reviewed. No pertinent surgical history.  Prior to Admission medications   Medication Sig Start Date End Date Taking? Authorizing Provider  HYDROcodone-acetaminophen (NORCO/VICODIN) 5-325 MG tablet Take 1 tablet by mouth every 4 (four) hours as needed for moderate pain. 10/07/18   Cuthriell, Delorise Royals, PA-C  levothyroxine (SYNTHROID, LEVOTHROID) 150 MCG tablet TAKE 1  TABLET BY MOUTH ONCE DAILY 08/17/18   Chrismon, Jodell Cipro, PA  lisinopril (PRINIVIL,ZESTRIL) 10 MG tablet TAKE 1 TABLET BY MOUTH ONCE DAILY 08/17/18   Chrismon, Jodell Cipro, PA  magic mouthwash w/lidocaine SOLN Take 5 mLs by mouth 4 (four) times daily. 10/07/18   Cuthriell, Delorise Royals, PA-C  Omega-3 Fatty Acids (FISH OIL) 1000 MG CAPS Take by mouth.    [provider]  pravastatin (PRAVACHOL) 20 MG tablet Take 1 tablet (20 mg total) by mouth daily. 03/03/18   Chrismon, Jodell Cipro, PA  Red Yeast Rice Extract (RED YEAST RICE PO) Take by mouth.    [provider]  tamsulosin (FLOMAX) 0.4 MG CAPS capsule Take 1 capsule (0.4 mg total) by mouth daily. Patient not taking: Reported on 03/03/2018 12/15/17   [name], MD    Allergies Patient has no known allergies.  Family History  Problem Relation Age of Onset  . Hypertension Mother   . Aneurysm Mother   . Heart attack Mother   . Lung cancer Father   . Healthy Sister   . Hypertension Brother   . Healthy Son   . Healthy Daughter   . Hypertension Maternal Grandmother   . Aneurysm Maternal Grandfather   . Alzheimer's disease Paternal Grandmother     Social History Social History   Tobacco Use  . Smoking status: Never Smoker  . Smokeless tobacco: Never Used  Substance Use Topics  . Alcohol use: Never    Frequency: Never  . Drug use: Never     Review of Systems  Constitutional: No fever/chills Eyes:  No visual changes. No discharge ENT: Positive for right-sided dental pain Cardiovascular: no chest pain. Respiratory: no cough. No SOB. Gastrointestinal: No abdominal pain.  No nausea, no vomiting.   Musculoskeletal: Negative for musculoskeletal pain. Skin: Negative for rash, abrasions, lacerations, ecchymosis. Neurological: Negative for headaches, focal weakness or numbness. 10-point ROS otherwise negative.  ____________________________________________   PHYSICAL EXAM:  VITAL SIGNS: ED Triage Vitals  Enc Vitals  Group     BP 10/07/18 1308 (!) 146/73     Pulse Rate 10/07/18 1308 63     Resp 10/07/18 1308 16     Temp 10/07/18 1308 98.8 F (37.1 C)     Temp Source 10/07/18 1308 Oral     SpO2 10/07/18 1308 98 %     Weight 10/07/18 1308 235 lb (106.6 kg)     Height 10/07/18 1308 6' (1.829 m)     Head Circumference --      Peak Flow --      Pain Score 10/07/18 1313 10     Pain Loc --      Pain Edu? --      Excl. in GC? --      Constitutional: Alert and oriented. Well appearing and in no acute distress. Eyes: Conjunctivae are normal. PERRL. EOMI. Head: Atraumatic. ENT:      Ears:       Nose: No congestion/rhinnorhea.      Mouth/Throat: Mucous membranes are moist.  Overall good dentition.  No obvious signs of erythema or edema concerning for infection to the right upper or lower dentition.  No open wounds.  No indication of intraoral burn.  Uvula is midline. Neck: No stridor.   Hematological/Lymphatic/Immunilogical: No cervical lymphadenopathy.  Cardiovascular: Normal rate, regular rhythm. Normal S1 and S2.  Good peripheral circulation. Respiratory: Normal respiratory effort without tachypnea or retractions. Lungs CTAB. Good air entry to the bases with no decreased or absent breath sounds. Musculoskeletal: Full range of motion to all extremities. No gross deformities appreciated. Neurologic:  Normal speech and language. No gross focal neurologic deficits are appreciated.  Skin:  Skin is warm, dry and intact. No rash noted. Psychiatric: Mood and affect are normal. Speech and behavior are normal. Patient exhibits appropriate insight and judgement.   ____________________________________________   LABS (all labs ordered are listed, but only abnormal results are displayed)  Labs Reviewed - No data to display ____________________________________________  EKG   ____________________________________________  RADIOLOGY   No results  found.  ____________________________________________    PROCEDURES  Procedure(s) performed:    .Nerve Block Date/Time: 10/07/2018 1:37 PM Performed by: Racheal Patchesuthriell, Jonathan D, PA-C Authorized by: Racheal Patchesuthriell, Jonathan D, PA-C   Consent:    Consent obtained:  Verbal   Consent given by:  Patient   Risks discussed:  Pain and unsuccessful block Indications:    Indications:  Pain relief Location:    Body area:  Head   Head nerve blocked: Inferior alveolar nerve.   Laterality:  Right Skin anesthesia (see MAR for exact dosages):    Skin anesthesia method:  None Procedure details (see MAR for exact dosages):    Block needle gauge:  27 G   Anesthetic injected:  Lidocaine 1% WITH epi   Steroid injected:  None   Additive injected:  None   Injection procedure:  Anatomic landmarks identified, introduced needle, negative aspiration for blood and incremental injection Post-procedure details:    Outcome:  Anesthesia achieved   Patient tolerance of procedure:  Tolerated well, no immediate complications  Medications  lidocaine-EPINEPHrine (XYLOCAINE W/EPI) 2 %-1:100000 (with pres) injection 1.7 mL (has no administration in time range)     ____________________________________________   INITIAL IMPRESSION / ASSESSMENT AND PLAN / ED COURSE  Pertinent labs & imaging results that were available during my care of the patient were reviewed by me and considered in my medical decision making (see chart for details).  Review of the [place] CSRS was performed in accordance of the [facility] prior to dispensing any controlled drugs.           Patient's diagnosis is consistent with dental pain.  Patient presented to the emergency department complaining of right-sided dental pain.  Overall, exam is reassuring with no signs of an action.  Patient was given a dental block for symptom relief.  At this time patient will be prescribed Magic mouthwash as well as a prescription for Ultram for symptom  relief.  If symptoms persist follow-up with dentist.  No indication for labs or imaging at this time. Patient is given ED precautions to return to the ED for any worsening or new symptoms.     ____________________________________________  FINAL CLINICAL IMPRESSION(S) / ED DIAGNOSES  Final diagnoses:  Pain, dental      NEW MEDICATIONS STARTED DURING THIS VISIT:  ED Discharge Orders         Ordered    magic mouthwash w/lidocaine SOLN  4 times daily     10/07/18 1345    HYDROcodone-acetaminophen (NORCO/VICODIN) 5-325 MG tablet  Every 4 hours PRN     10/07/18 1345              This chart was dictated using voice recognition software/Dragon. Despite best efforts to proofread, errors can occur which can change the meaning. Any change was purely unintentional.    Racheal Patches, PA-C 10/07/18 1345    Dionne Bucy, MD 10/07/18 2011

## 2018-10-07 NOTE — ED Triage Notes (Signed)
Right side tooth pain started last night.  Says started after he ate something hot.

## 2018-10-24 ENCOUNTER — Emergency Department
Admission: EM | Admit: 2018-10-24 | Discharge: 2018-10-24 | Disposition: A | Discharge status: Home / Self Care | Payer: No Typology Code available for payment source | Attending: Emergency Medicine | Admitting: Emergency Medicine

## 2018-10-24 ENCOUNTER — Other Ambulatory Visit: Payer: Self-pay

## 2018-10-24 DIAGNOSIS — K0889 Other specified disorders of teeth and supporting structures: Secondary | ICD-10-CM | POA: Diagnosis not present

## 2018-10-24 DIAGNOSIS — Z79899 Other long term (current) drug therapy: Secondary | ICD-10-CM | POA: Diagnosis not present

## 2018-10-24 DIAGNOSIS — I10 Essential (primary) hypertension: Secondary | ICD-10-CM | POA: Diagnosis not present

## 2018-10-24 DIAGNOSIS — E039 Hypothyroidism, unspecified: Secondary | ICD-10-CM | POA: Diagnosis not present

## 2018-10-24 MED ORDER — HYDROCODONE-ACETAMINOPHEN 5-325 MG PO TABS: 1.0000 | ORAL_TABLET | ORAL | 0 refills | Status: DC | PRN

## 2018-10-24 MED ORDER — AMOXICILLIN 875 MG PO TABS: 875.0000 mg | ORAL_TABLET | Freq: Two times a day (BID) | ORAL | 0 refills | Status: DC

## 2018-10-24 MED ORDER — MAGIC MOUTHWASH W/LIDOCAINE: 5.0000 mL | Freq: Four times a day (QID) | ORAL | 0 refills | Status: DC

## 2018-10-24 NOTE — Discharge Instructions (Addendum)
OPTIONS FOR DENTAL FOLLOW UP CARE ° °[place] Department of Health and Human Services - Local Safety Net Dental Clinics °http://www.ncdhhs.gov/dph/oralhealth/services/safetynetclinics.htm °  °[facility] (336-562-3123) ° °[facility] [place] (919-933-9087) ° °[facility] ([phone] ext [phone]) ° °[facility] ([phone]) ° °[facility] ([phone]) °This clinic caters to the indigent population and is on a lottery system. °Location: °[facility], [address], [place] °Clinic Hours: °Wednesdays from 6pm - 9pm, patients seen by a lottery system. °For dates, call or go to [email or web address].[facility].edu/[facility]/patients/Dental-[facility] °Services: °Cleanings, fillings and simple extractions. °Payment Options: °DENTAL WORK IS FREE OF CHARGE. Bring proof of income or support. °Best way to get seen: °Arrive at 5:15 pm - this is a lottery, NOT first come/first serve, so arriving earlier will not increase your chances of being seen. °  °  °[facility] °[phone] °Select option 1 for emergencies °  °Location: °[facility], [address], [place] °Clinic Hours: °No walk-ins accepted - call the day before to schedule an appointment. °Check in times are 9:30 am and 1:30 pm. °Services: °Simple extractions, temporary fillings, pulpectomy/pulp debridement, uncomplicated abscess drainage. °Payment Options: °PAYMENT IS DUE AT THE TIME OF SERVICE.  Fee is usually $100-200, additional surgical procedures (e.g. abscess drainage) may be extra. °Cash, checks, Visa/MasterCard accepted.  Can file Medicaid if patient is covered for dental - patient should call case worker to check. °No discount for [facility] patients. °Best way to get seen: °MUST call the day before and get onto the schedule. Can usually be seen the next 1-2 days. No walk-ins accepted. °  °  °[facility] °[phone] °   °Location: °[facility], [address], [place] °Clinic Hours: °M, W, Th, F 8am or 1:30pm, Tues 9a or 1:30 - first come/first served. °Services: °Simple extractions, temporary fillings, uncomplicated abscess drainage.  You do not need to be an [place] resident. °Payment Options: °PAYMENT IS DUE AT THE TIME OF SERVICE. °Dental insurance, otherwise sliding scale - bring proof of income or support. °Depending on income and treatment needed, cost is usually $50-200. °Best way to get seen: °Arrive early as it is first come/first served. °  °  °[facility] °[phone] °  °Location: °[address] °Clinic Hours: °Mon-Thu 8a-5p °Services: °Most basic dental services including extractions and fillings. °Payment Options: °PAYMENT IS DUE AT THE TIME OF SERVICE. °Sliding scale, up to 50% off - bring proof if income or support. °Medicaid with dental option accepted. °Best way to get seen: °Call to schedule an appointment, can usually be seen within 2 weeks OR they will try to see walk-ins - show up at 8a or 2p (you may have to wait). °  °  °[facility] °[phone] °[place] RESIDENTS ONLY °  °Location: °[facility], [address], [place], [place] [address] °Clinic Hours: By appointment only. °Monday - Thursday 8am-5pm, Friday 8am-12pm °Services: Cleanings, fillings, extractions. °Payment Options: °PAYMENT IS DUE AT THE TIME OF SERVICE. °Cash, Visa or MasterCard. Sliding scale - $30 minimum per service. °Best way to get seen: °Come in to office, complete packet and make an appointment - need proof of income °or support monies for each household member and proof of [place] residence. °Usually takes about a month to get in. °  °  °[facility] °[phone] °  °Location: °[address]   [place] °Clinic Hours: Walk-in Urgent Care Dental Services are offered Monday-Friday  mornings only. °The numbers of emergencies accepted daily is limited to the number of °providers available. °Maximum 15 - Mondays, Wednesdays & Thursdays °Maximum 10 - Tuesdays & Fridays °Services: °You do not need to be a [place] resident to be seen for a dental emergency. °Emergencies are defined as pain, swelling, abnormal bleeding, or dental trauma. Walkins will receive x-rays if needed. °NOTE: Dental cleaning is not an emergency. °Payment Options: °PAYMENT IS DUE AT THE TIME OF SERVICE. °Minimum co-pay is $40.00 for uninsured patients. °Minimum co-pay is $3.00 for Medicaid with dental coverage. °Dental Insurance is accepted and must be presented at time of visit. °Medicare does not cover dental. °Forms of payment: Cash, credit card, checks. °Best way to get seen: °If not previously registered with the clinic, walk-in dental registration begins at 7:15 am and is on a first come/first serve basis. °If previously registered with the clinic, call to make an appointment. °  °  °[facility] °[phone] °[place] RESIDENTS ONLY °  °Location: °[address], [place], [place] °Clinic Hours: °Mon-Thu 10a-2p °Services: Extractions only! °Payment Options: °FREE (donations accepted) - bring proof of income or support °Best way to get seen: °Call and schedule an appointment OR come at 8am on the 1st Monday of every month (except for holidays) when it is first come/first served. °  °  °[facility] °[phone] °  °Location: °[address], [place] °Clinic Hours: °Friday mornings °Services, Payment Options, Best way to get seen: °Call for info °

## 2018-10-24 NOTE — ED Triage Notes (Signed)
Pt states R sided dental pain. Was given "shot in the mouth and rinse and pain pills." that was 2 weeks ago. states started feeling better so stopped taking meds. Denies taking antibiotics. Has been unable to get in with dentist. Speaking in complete sentences. A&O, ambulatory.

## 2018-10-24 NOTE — ED Provider Notes (Signed)
[facility] Emergency Department Provider Note  ____________________________________________   First MD Initiated Contact with Patient 10/24/18 1024     (approximate)  I have reviewed the triage vital signs and the nursing notes.   HISTORY  Chief Complaint Dental Pain    HPI Scott Holloway is a 53 y.o. male presents emergency department complaining of right-sided dental pain for 2 days.  States he was seen here approximately 2 weeks ago and was given Dukes mouthwash along with an injection in his mouth with pain medicine.  He was also given pain pills.  He states he is tried not to use the pain pills and it was feeling better.  He states that he drink ice cold water and his teeth have all become very sensitive again and he is having pain across the upper and lower teeth.  All pain seems to be located on the right side.  He denies any chest pain or shortness of breath.  Denies any fever or chills.    Past Medical History:  Diagnosis Date  . Graves disease 11/03/2017  . Hyperlipidemia 11/03/2017  . Hypertension 11/03/2017  . Hypothyroidism 11/03/2017  . Pneumothorax 1997    Patient Active Problem List   Diagnosis Date Noted  . Elevated PSA 12/15/2017  . Hyperlipidemia 11/03/2017  . Hypertension 11/03/2017  . Hypothyroidism (acquired) 11/03/2017  . Graves disease 11/03/2017  . Pneumothorax 07/02/1995    History reviewed. No pertinent surgical history.  Prior to Admission medications   Medication Sig Start Date End Date Taking? Authorizing Provider  amoxicillin (AMOXIL) 875 MG tablet Take 1 tablet (875 mg total) by mouth 2 (two) times daily. 10/24/18   Fisher, Roselyn BeringSusan W, PA-C  HYDROcodone-acetaminophen (NORCO/VICODIN) 5-325 MG tablet Take 1 tablet by mouth every 4 (four) hours as needed for moderate pain. 10/24/18   Fisher, Roselyn BeringSusan W, PA-C  levothyroxine (SYNTHROID, LEVOTHROID) 150 MCG tablet TAKE 1 TABLET BY MOUTH ONCE DAILY 08/17/18   Chrismon, Jodell Ciproennis E, PA   lisinopril (PRINIVIL,ZESTRIL) 10 MG tablet TAKE 1 TABLET BY MOUTH ONCE DAILY 08/17/18   Chrismon, Jodell Ciproennis E, PA  magic mouthwash w/lidocaine SOLN Take 5 mLs by mouth 4 (four) times daily. 10/24/18   Faythe GheeFisher, Susan W, PA-C  Omega-3 Fatty Acids (FISH OIL) 1000 MG CAPS Take by mouth.    [provider]  pravastatin (PRAVACHOL) 20 MG tablet Take 1 tablet (20 mg total) by mouth daily. 03/03/18   Chrismon, Jodell Ciproennis E, PA  Red Yeast Rice Extract (RED YEAST RICE PO) Take by mouth.    [provider]  tamsulosin (FLOMAX) 0.4 MG CAPS capsule Take 1 capsule (0.4 mg total) by mouth daily. Patient not taking: Reported on 03/03/2018 12/15/17   Riki AltesStoioff, Scott C, MD    Allergies Patient has no known allergies.  Family History  Problem Relation Age of Onset  . Hypertension Mother   . Aneurysm Mother   . Heart attack Mother   . Lung cancer Father   . Healthy Sister   . Hypertension Brother   . Healthy Son   . Healthy Daughter   . Hypertension Maternal Grandmother   . Aneurysm Maternal Grandfather   . Alzheimer's disease Paternal Grandmother     Social History Social History   Tobacco Use  . Smoking status: Never Smoker  . Smokeless tobacco: Never Used  Substance Use Topics  . Alcohol use: Never    Frequency: Never  . Drug use: Never    Review of Systems  Constitutional: No  fever/chills Eyes: No visual changes. ENT: No sore throat.  Positive for dental pain Respiratory: Denies cough Genitourinary: Negative for dysuria. Musculoskeletal: Negative for back pain. Skin: Negative for rash.    ____________________________________________   PHYSICAL EXAM:  VITAL SIGNS: ED Triage Vitals [10/24/18 1016]  Enc Vitals Group     BP (!) 161/85     Pulse Rate 67     Resp 16     Temp 98.2 F (36.8 C)     Temp Source Oral     SpO2 97 %     Weight 235 lb (106.6 kg)     Height 6' (1.829 m)     Head Circumference      Peak Flow      Pain Score 1     Pain Loc      Pain Edu?       Excl. in GC?     Constitutional: Alert and oriented. Well appearing and in no acute distress. Eyes: Conjunctivae are normal.  Head: Atraumatic. Nose: No congestion/rhinnorhea. Mouth/Throat: Mucous membranes are moist.  Lower molar is tender to tapping with the tongue depressor, upper molars are also tender, no cavities are noted. Neck:  supple no lymphadenopathy noted Cardiovascular: Normal rate, regular rhythm. Heart sounds are normal Respiratory: Normal respiratory effort.  No retractions, lungs c t a  GU: deferred Musculoskeletal: FROM all extremities, warm and well perfused Neurologic:  Normal speech and language.  Skin:  Skin is warm, dry and intact. No rash noted. Psychiatric: Mood and affect are normal. Speech and behavior are normal.  ____________________________________________   LABS (all labs ordered are listed, but only abnormal results are displayed)  Labs Reviewed - No data to display ____________________________________________   ____________________________________________  RADIOLOGY    ____________________________________________   PROCEDURES  Procedure(s) performed: No  Procedures    ____________________________________________   INITIAL IMPRESSION / ASSESSMENT AND PLAN / ED COURSE  Pertinent labs & imaging results that were available during my care of the patient were reviewed by me and considered in my medical decision making (see chart for details).   Patient is 53 year old male presents emergency department with dental pain.  Physical exam shows the molars on the right upper and lower side to be tender.  Discussed the findings with patient.  Due to the sensitivity with hot and cold explained to him he should buy Sensodyne toothpaste.  He could rub this on his tooth.  He should also brush his teeth with Sensodyne.  Since the pain has returned in this capacity he was also given a prescription for amoxicillin, Vicodin, and a repeat of the  Dukes Magic mouthwash.  He is to follow-up with a dentist.  A list of dental clinics was provided for him.  Due to the coronavirus pandemic many dentist are closed.  Explained to him we should leave a message on her answering machine if several of them are still doing emergency work-ups.  He states he understands and will comply.  He was discharged in stable condition.     As part of my medical decision making, I reviewed the following data within the electronic MEDICAL RECORD NUMBER Nursing notes reviewed and incorporated, Old chart reviewed, Notes from prior ED visits and [place] Controlled Substance Database  ____________________________________________   FINAL CLINICAL IMPRESSION(S) / ED DIAGNOSES  Final diagnoses:  Pain, dental      NEW MEDICATIONS STARTED DURING THIS VISIT:  New Prescriptions   AMOXICILLIN (AMOXIL) 875 MG TABLET    Take 1 tablet (  875 mg total) by mouth 2 (two) times daily.     Note:  This document was prepared using Dragon voice recognition software and may include unintentional dictation errors.    Faythe Ghee, PA-C 10/24/18 1057    Phineas Semen, MD 10/24/18 [phone]

## 2018-10-24 NOTE — ED Notes (Addendum)
Patient presents c/o right sided dental pain for past 2 days. Pt states he drank cold water and has "sensitive teeth" and states pain has not gotten better. No acute distress noted.

## 2018-12-10 ENCOUNTER — Other Ambulatory Visit: Payer: Self-pay

## 2018-12-10 ENCOUNTER — Encounter: Payer: Self-pay | Admitting: Family Medicine

## 2018-12-10 ENCOUNTER — Ambulatory Visit (INDEPENDENT_AMBULATORY_CARE_PROVIDER_SITE_OTHER): Payer: No Typology Code available for payment source | Admitting: Family Medicine

## 2018-12-10 VITALS — BP 160/84 | HR 50 | Temp 98.5°F | Ht 72.0 in | Wt 234.0 lb

## 2018-12-10 DIAGNOSIS — E782 Mixed hyperlipidemia: Secondary | ICD-10-CM | POA: Diagnosis not present

## 2018-12-10 DIAGNOSIS — E039 Hypothyroidism, unspecified: Secondary | ICD-10-CM

## 2018-12-10 DIAGNOSIS — R972 Elevated prostate specific antigen [PSA]: Secondary | ICD-10-CM

## 2018-12-10 DIAGNOSIS — R3129 Other microscopic hematuria: Secondary | ICD-10-CM

## 2018-12-10 DIAGNOSIS — I10 Essential (primary) hypertension: Secondary | ICD-10-CM

## 2018-12-10 LAB — POCT URINALYSIS DIPSTICK
Bilirubin, UA: NEGATIVE
Glucose, UA: NEGATIVE
Ketones, UA: NEGATIVE
Leukocytes, UA: NEGATIVE
Nitrite, UA: NEGATIVE
Protein, UA: POSITIVE — AB
Spec Grav, UA: 1.02 (ref 1.010–1.025)
Urobilinogen, UA: 0.2 E.U./dL
pH, UA: 6 (ref 5.0–8.0)

## 2018-12-10 MED ORDER — LISINOPRIL 10 MG PO TABS: 10.0000 mg | ORAL_TABLET | Freq: Every day | ORAL | 3 refills | Status: DC

## 2018-12-10 MED ORDER — PRAVASTATIN SODIUM 20 MG PO TABS: 20.0000 mg | ORAL_TABLET | Freq: Every day | ORAL | 3 refills | Status: DC

## 2018-12-10 MED ORDER — LEVOTHYROXINE SODIUM 150 MCG PO TABS: 150.0000 ug | ORAL_TABLET | Freq: Every day | ORAL | 3 refills | Status: DC

## 2018-12-10 NOTE — Progress Notes (Signed)
Scott CirriKelvin L Holloway  MRN: [id number] DOB: 1965-09-28  Subjective:  HPI   The patient is a [age] year old male who presents to discuss some abnormal labs obtained through his insurance company.  The patient states he was denied insurance due to his Ht/Wt, elevated PSA, abnormal kidney function and blood and protein in his urine.   The patient has had elevated PSA in the past and was seen and treated by Dr. Lonna CobbStoioff about 1 year algo  Patient Active Problem List   Diagnosis Date Noted  . Elevated PSA 12/15/2017  . Hyperlipidemia 11/03/2017  . Hypertension 11/03/2017  . Hypothyroidism (acquired) 11/03/2017  . Graves disease 11/03/2017  . Pneumothorax 07/02/1995   Past Medical History:  Diagnosis Date  . Graves disease 11/03/2017  . Hyperlipidemia 11/03/2017  . Hypertension 11/03/2017  . Hypothyroidism 11/03/2017  . Pneumothorax 1997   No past surgical history on file.  Family History  Problem Relation Age of Onset  . Hypertension Mother   . Aneurysm Mother   . Heart attack Mother   . Lung cancer Father   . Healthy Sister   . Hypertension Brother   . Healthy Son   . Healthy Daughter   . Hypertension Maternal Grandmother   . Aneurysm Maternal Grandfather   . Alzheimer's disease Paternal Grandmother    Social History   Socioeconomic History  . Marital status: Married    Spouse name: Not on file  . Number of children: Not on file  . Years of education: Not on file  . Highest education level: Not on file  Occupational History  . Not on file  Social Needs  . Financial resource strain: Not on file  . Food insecurity    Worry: Not on file    Inability: Not on file  . Transportation needs    Medical: Not on file    Non-medical: Not on file  Tobacco Use  . Smoking status: Never Smoker  . Smokeless tobacco: Never Used  Substance and Sexual Activity  . Alcohol use: Never    Frequency: Never  . Drug use: Never  . Sexual activity: Yes    Birth control/protection: None  Lifestyle   . Physical activity    Days per week: Not on file    Minutes per session: Not on file  . Stress: Not on file  Relationships  . Social Musicianconnections    Talks on phone: Not on file    Gets together: Not on file    Attends religious service: Not on file    Active member of club or organization: Not on file    Attends meetings of clubs or organizations: Not on file    Relationship status: Not on file  . Intimate partner violence    Fear of current or ex partner: Not on file    Emotionally abused: Not on file    Physically abused: Not on file    Forced sexual activity: Not on file  Other Topics Concern  . Not on file  Social History Narrative  . Not on file   Outpatient Encounter Medications as of 12/10/2018  Medication Sig  . levothyroxine (SYNTHROID, LEVOTHROID) 150 MCG tablet TAKE 1 TABLET BY MOUTH ONCE DAILY  . lisinopril (PRINIVIL,ZESTRIL) 10 MG tablet TAKE 1 TABLET BY MOUTH ONCE DAILY  . Omega-3 Fatty Acids (FISH OIL) 1000 MG CAPS Take by mouth.  . pravastatin (PRAVACHOL) 20 MG tablet Take 1 tablet (20 mg total) by mouth daily.  . Red Yeast  Rice Extract (RED YEAST RICE PO) Take by mouth.  . tamsulosin (FLOMAX) 0.4 MG CAPS capsule Take 1 capsule (0.4 mg total) by mouth daily. (Patient not taking: Reported on 03/03/2018)  . [DISCONTINUED] amoxicillin (AMOXIL) 875 MG tablet Take 1 tablet (875 mg total) by mouth 2 (two) times daily.  . [DISCONTINUED] HYDROcodone-acetaminophen (NORCO/VICODIN) 5-325 MG tablet Take 1 tablet by mouth every 4 (four) hours as needed for moderate pain.  . [DISCONTINUED] magic mouthwash w/lidocaine SOLN Take 5 mLs by mouth 4 (four) times daily.   No facility-administered encounter medications on file as of 12/10/2018.    No Known Allergies  Review of Systems  Genitourinary: Negative for dysuria, flank pain, frequency, hematuria (patient does not see any) and urgency.    Objective:  BP (!) 160/84 (BP Location: Right Arm, Patient Position: Sitting, Cuff  Size: Normal)   Pulse (!) 50   Temp 98.5 F (36.9 C) (Oral)   Ht 6' (1.829 m)   Wt 234 lb (106.1 kg)   SpO2 98%   BMI 31.74 kg/m   Physical Exam  Constitutional: He is oriented to person, place, and time and well-developed, well-nourished, and in no distress.  HENT:  Head: Normocephalic.  Eyes: Conjunctivae are normal.  Neck: Neck supple.  Cardiovascular: Normal rate and regular rhythm.  Pulmonary/Chest: Effort normal and breath sounds normal.  Abdominal: Soft. Bowel sounds are normal.  Genitourinary:    Prostate, penis and rectum normal.  Rectum:     Guaiac result negative.   Musculoskeletal: Normal range of motion.  Neurological: He is alert and oriented to person, place, and time.  Skin: No rash noted.  Psychiatric: Mood, affect and judgment normal.    Assessment and Plan :  1. Other microscopic hematuria States application exam for life insurance raised the question of prostate or kidney disease. Denies gross hematuria, dysuria or frequency. Urinalysis showed positive proteins and 3+ RBC's. Will check urine C&S with labs to rule out renal disease or prostate cancer. Increase fluid/water intake and recheck pending reports. - POCT urinalysis dipstick - Urine Culture - CBC with Differential/Platelet - Comprehensive metabolic panel - PSA  2. Elevated PSA History of PSA up to 9.3 on 11-06-17. Was evaluated by urologist (Dr. Bernardo Heater) and treated with Tamsulosin. PSA went down to 42.5 by 01-14-18. DRE unremarkable today and denies hesitancy, frequency, decreased stream or nocturia. Will recheck CMP and PSA. May need to restart Tamsulosin if PSA back up. - Comprehensive metabolic panel - PSA  3. Essential hypertension Slight elevation of BP (repeat BP was 140/84 during exam). No chest pains, palpitations, peripheral edema or dizziness. Refilled Lisinopril 10 mg qd and will get follow up labs. - CBC with Differential/Platelet - Comprehensive metabolic panel - TSH  4. Mixed  hyperlipidemia Tolerating the low fat diet and has lost some weight in the past year. Continues to take the pravastatin 20 mg qd. Recheck routine labs and refilled medications. - Comprehensive metabolic panel - TSH - Lipid panel  5. Hypothyroidism (acquired) Feeling well without fatigue, tremor, constipation, hair loss, myxedema or exophthalmos. History of Grave's disease and been on Levothyroxine 150 mg qd since initial treatment. Recheck labs and refill Levothyroxine. - CBC with Differential/Platelet - Comprehensive metabolic panel - T4 - TSH

## 2018-12-11 ENCOUNTER — Telehealth: Payer: Self-pay

## 2018-12-11 LAB — LIPID PANEL
Chol/HDL Ratio: 5.8 ratio — ABNORMAL HIGH (ref 0.0–5.0)
Cholesterol, Total: 271 mg/dL — ABNORMAL HIGH (ref 100–199)
HDL: 47 mg/dL (ref >39)
LDL Calculated: 182 mg/dL — ABNORMAL HIGH (ref 0–99)
Triglycerides: 212 mg/dL — ABNORMAL HIGH (ref 0–149)
VLDL Cholesterol Cal: 42 mg/dL — ABNORMAL HIGH (ref 5–40)

## 2018-12-11 LAB — COMPREHENSIVE METABOLIC PANEL
ALT: 12 IU/L (ref 0–44)
AST: 22 IU/L (ref 0–40)
Albumin/Globulin Ratio: 1.4 (ref 1.2–2.2)
Albumin: 4 g/dL (ref 3.8–4.9)
Alkaline Phosphatase: 54 IU/L (ref 39–117)
BUN/Creatinine Ratio: 15 (ref 9–20)
BUN: 23 mg/dL (ref 6–24)
Bilirubin Total: <0.2 mg/dL (ref 0.0–1.2)
CO2: 23 mmol/L (ref 20–29)
Calcium: 9.4 mg/dL (ref 8.7–10.2)
Chloride: 102 mmol/L (ref 96–106)
Creatinine, Ser: 1.51 mg/dL — ABNORMAL HIGH (ref 0.76–1.27)
GFR calc Af Amer: 59 mL/min/{1.73_m2} — ABNORMAL LOW (ref >59)
GFR calc non Af Amer: 51 mL/min/{1.73_m2} — ABNORMAL LOW (ref >59)
Globulin, Total: 2.8 g/dL (ref 1.5–4.5)
Glucose: 89 mg/dL (ref 65–99)
Potassium: 4.2 mmol/L (ref 3.5–5.2)
Sodium: 139 mmol/L (ref 134–144)
Total Protein: 6.8 g/dL (ref 6.0–8.5)

## 2018-12-11 LAB — CBC WITH DIFFERENTIAL/PLATELET
Basophils Absolute: 0.1 10*3/uL (ref 0.0–0.2)
Basos: 1 %
EOS (ABSOLUTE): 0.3 10*3/uL (ref 0.0–0.4)
Eos: 3 %
Hematocrit: 40.1 % (ref 37.5–51.0)
Hemoglobin: 13.1 g/dL (ref 13.0–17.7)
Immature Grans (Abs): 0 10*3/uL (ref 0.0–0.1)
Immature Granulocytes: 0 %
Lymphocytes Absolute: 2.8 10*3/uL (ref 0.7–3.1)
Lymphs: 33 %
MCH: 29.1 pg (ref 26.6–33.0)
MCHC: 32.7 g/dL (ref 31.5–35.7)
MCV: 89 fL (ref 79–97)
Monocytes Absolute: 0.6 10*3/uL (ref 0.1–0.9)
Monocytes: 7 %
Neutrophils Absolute: 4.8 10*3/uL (ref 1.4–7.0)
Neutrophils: 56 %
Platelets: 248 10*3/uL (ref 150–450)
RBC: 4.5 x10E6/uL (ref 4.14–5.80)
RDW: 13.1 % (ref 11.6–15.4)
WBC: 8.6 10*3/uL (ref 3.4–10.8)

## 2018-12-11 LAB — T4: T4, Total: 6.7 ug/dL (ref 4.5–12.0)

## 2018-12-11 LAB — TSH: TSH: 4.18 u[IU]/mL (ref 0.450–4.500)

## 2018-12-11 LAB — PSA: Prostate Specific Ag, Serum: 3.7 ng/mL (ref 0.0–4.0)

## 2018-12-11 NOTE — Telephone Encounter (Signed)
-----   Message from Margo Common, [place] sent at 12/11/2018 10:03 AM EDT ----- Prostate test still in normal range over the past year, but, kidney function shows some strain with increase in creatinine. Need to drink 4-6 glasses of water daily and awaiting final urine culture report. Total cholesterol, triglycerides and LDL still high. Need to increase Pravastatin to 40 mg qd and be sure to take Lisinopril 10 mg qd. Recheck BP at home daily and call report of readings in 2 weeks. Should recheck cholesterol in 3 months. If urine culture negative, will need to recheck kidney function test and urinalysis in a month.

## 2018-12-11 NOTE — Telephone Encounter (Signed)
Patient notified of lab results, medication changes, water intake, and follow up appointment scheduled.

## 2018-12-12 LAB — URINE CULTURE: Organism ID, Bacteria: NO GROWTH

## 2018-12-23 ENCOUNTER — Telehealth: Payer: Self-pay

## 2018-12-23 NOTE — Telephone Encounter (Signed)
Advised  ED 

## 2018-12-23 NOTE — Telephone Encounter (Signed)
Patient is calling to request lab results. He would like a call back ASAP because he works 3rd shift.

## 2019-01-14 ENCOUNTER — Ambulatory Visit (INDEPENDENT_AMBULATORY_CARE_PROVIDER_SITE_OTHER): Payer: No Typology Code available for payment source | Admitting: Family Medicine

## 2019-01-14 ENCOUNTER — Other Ambulatory Visit: Payer: Self-pay

## 2019-01-14 ENCOUNTER — Encounter: Payer: Self-pay | Admitting: Family Medicine

## 2019-01-14 VITALS — BP 142/84 | HR 71 | Temp 98.5°F | Wt 237.0 lb

## 2019-01-14 DIAGNOSIS — E782 Mixed hyperlipidemia: Secondary | ICD-10-CM | POA: Diagnosis not present

## 2019-01-14 DIAGNOSIS — N289 Disorder of kidney and ureter, unspecified: Secondary | ICD-10-CM | POA: Diagnosis not present

## 2019-01-14 DIAGNOSIS — Z87448 Personal history of other diseases of urinary system: Secondary | ICD-10-CM

## 2019-01-14 LAB — POCT URINALYSIS DIPSTICK
Bilirubin, UA: NEGATIVE
Glucose, UA: NEGATIVE
Ketones, UA: NEGATIVE
Leukocytes, UA: NEGATIVE
Nitrite, UA: NEGATIVE
Protein, UA: POSITIVE — AB
Spec Grav, UA: 1.02 (ref 1.010–1.025)
Urobilinogen, UA: 0.2 E.U./dL
pH, UA: 6 (ref 5.0–8.0)

## 2019-01-14 MED ORDER — PRAVASTATIN SODIUM 40 MG PO TABS: 40.0000 mg | ORAL_TABLET | Freq: Every day | ORAL | 3 refills | Status: DC

## 2019-01-14 NOTE — Progress Notes (Signed)
Scott Holloway  MRN: [id number] DOB: Nov 28, 1965  Subjective:  HPI   The patient is a 53 year old male who presents for follow up of his hematuria and abnormal kidney function.  He was last seen on 12/10/18.  At that time he did have microscopic hematuria with negative urine culture.  He states today he has not noticed any blood in the urine but admits that he really had not been paying attention to it.   The patient did note that his blood pressure was a little higher than he wanted it to be.  He states he is on a low dose and we discussed letting the provider know that he was somewhat concerned about it and wondered if any other intervention was needed.  Patient Active Problem List   Diagnosis Date Noted  . Elevated PSA 12/15/2017  . Hyperlipidemia 11/03/2017  . Hypertension 11/03/2017  . Hypothyroidism (acquired) 11/03/2017  . Graves disease 11/03/2017  . Pneumothorax 07/02/1995   Past Medical History:  Diagnosis Date  . Graves disease 11/03/2017  . Hyperlipidemia 11/03/2017  . Hypertension 11/03/2017  . Hypothyroidism 11/03/2017  . Pneumothorax 1997   No past surgical history on file.   Family History  Problem Relation Age of Onset  . Hypertension Mother   . Aneurysm Mother   . Heart attack Mother   . Lung cancer Father   . Healthy Sister   . Hypertension Brother   . Healthy Son   . Healthy Daughter   . Hypertension Maternal Grandmother   . Aneurysm Maternal Grandfather   . Alzheimer's disease Paternal Grandmother    Social History   Socioeconomic History  . Marital status: Married    Spouse name: Not on file  . Number of children: Not on file  . Years of education: Not on file  . Highest education level: Not on file  Occupational History  . Not on file  Social Needs  . Financial resource strain: Not on file  . Food insecurity    Worry: Not on file    Inability: Not on file  . Transportation needs    Medical: Not on file    Non-medical: Not on file  Tobacco  Use  . Smoking status: Never Smoker  . Smokeless tobacco: Never Used  Substance and Sexual Activity  . Alcohol use: Never    Frequency: Never  . Drug use: Never  . Sexual activity: Yes    Birth control/protection: None  Lifestyle  . Physical activity    Days per week: Not on file    Minutes per session: Not on file  . Stress: Not on file  Relationships  . Social Musicianconnections    Talks on phone: Not on file    Gets together: Not on file    Attends religious service: Not on file    Active member of club or organization: Not on file    Attends meetings of clubs or organizations: Not on file    Relationship status: Not on file  . Intimate partner violence    Fear of current or ex partner: Not on file    Emotionally abused: Not on file    Physically abused: Not on file    Forced sexual activity: Not on file  Other Topics Concern  . Not on file  Social History Narrative  . Not on file    Outpatient Encounter Medications as of 01/14/2019  Medication Sig  . levothyroxine (SYNTHROID) 150 MCG tablet Take 1 tablet (150  mcg total) by mouth daily.  Marland Kitchen lisinopril (ZESTRIL) 10 MG tablet Take 1 tablet (10 mg total) by mouth daily.  . Omega-3 Fatty Acids (FISH OIL) 1000 MG CAPS Take by mouth.  . pravastatin (PRAVACHOL) 20 MG tablet Take 1 tablet (20 mg total) by mouth daily.  . tamsulosin (FLOMAX) 0.4 MG CAPS capsule Take 1 capsule (0.4 mg total) by mouth daily. (Patient not taking: Reported on 03/03/2018)  . [DISCONTINUED] Red Yeast Rice Extract (RED YEAST RICE PO) Take by mouth.   No facility-administered encounter medications on file as of 01/14/2019.    No Known Allergies  Review of Systems  Respiratory: Negative for cough, shortness of breath and wheezing.   Cardiovascular: Negative for chest pain, palpitations, orthopnea, claudication and leg swelling.  Genitourinary: Positive for frequency. Negative for dysuria, flank pain, hematuria and urgency.    Objective:  BP (!) 142/84 (BP  Location: Right Arm, Patient Position: Sitting, Cuff Size: Normal)   Pulse 71   Temp 98.5 F (36.9 C) (Oral)   Wt 237 lb (107.5 kg)   SpO2 97%   BMI 32.14 kg/m   BP Readings from Last 3 Encounters:  01/14/19 (!) 142/84  12/10/18 (!) 160/84  10/24/18 (!) 161/85     Physical Exam  Constitutional: He is oriented to person, place, and time and well-developed, well-nourished, and in no distress.  HENT:  Head: Normocephalic.  Eyes: Conjunctivae are normal.  Neck: Neck supple.  Cardiovascular: Normal rate.  Pulmonary/Chest: Effort normal and breath sounds normal.  Abdominal: Soft. Bowel sounds are normal.  Musculoskeletal: Normal range of motion.  Neurological: He is alert and oriented to person, place, and time.  Skin: No rash noted.  Psychiatric: Mood, affect and judgment normal.    Assessment and Plan :   1. History of hematuria No gross hematuria, dysuria, fever, back pain, hesitancy, urgency, nocturia or decreased stream. Microscopic exam of urine showed TNTC RBC's per hpf. Will schedule follow up with Dr. Bernardo Heater (urologist) to evaluate asymptomatic hematuria. - POCT urinalysis dipstick - Ambulatory referral to Urology  2. Abnormal kidney function Creatinine was 1.51 with GFR 59 a month ago with blood on dipstick.. Will recheck for any worsening.  - Renal function panel - Ambulatory referral to Urology  3. Mixed hyperlipidemia Tolerating the increase of Pravastatin to 40 mg qd without muscle or joint pains. No significant fatigue issues. Recommend more exercise and low fat diet to help with weight loss. Changed Pravastatin prescription at the pharmacy. - pravastatin (PRAVACHOL) 40 MG tablet; Take 1 tablet (40 mg total) by mouth daily.  Dispense: 90 tablet; Refill: 3

## 2019-01-15 ENCOUNTER — Telehealth: Payer: Self-pay

## 2019-01-15 LAB — RENAL FUNCTION PANEL
Albumin: 3.8 g/dL (ref 3.8–4.9)
BUN/Creatinine Ratio: 11 (ref 9–20)
BUN: 17 mg/dL (ref 6–24)
CO2: 24 mmol/L (ref 20–29)
Calcium: 9.3 mg/dL (ref 8.7–10.2)
Chloride: 101 mmol/L (ref 96–106)
Creatinine, Ser: 1.49 mg/dL — ABNORMAL HIGH (ref 0.76–1.27)
GFR calc Af Amer: 60 mL/min/{1.73_m2} (ref >59)
GFR calc non Af Amer: 52 mL/min/{1.73_m2} — ABNORMAL LOW (ref >59)
Glucose: 89 mg/dL (ref 65–99)
Phosphorus: 4.4 mg/dL — ABNORMAL HIGH (ref 2.8–4.1)
Potassium: 4.4 mmol/L (ref 3.5–5.2)
Sodium: 144 mmol/L (ref 134–144)

## 2019-01-15 NOTE — Telephone Encounter (Signed)
Advised.  Patient has OV with urology on 02/25/19

## 2019-01-15 NOTE — Telephone Encounter (Signed)
[facility] ED 

## 2019-01-15 NOTE — Telephone Encounter (Signed)
-----   Message from Margo Common, [place] sent at 01/15/2019  8:42 AM EDT ----- Blood tests did not show significant changes in creatinine level from a month ago. Should proceed with recheck by urologist for blood in urine.

## 2019-02-25 ENCOUNTER — Other Ambulatory Visit: Payer: Self-pay

## 2019-02-25 ENCOUNTER — Ambulatory Visit (INDEPENDENT_AMBULATORY_CARE_PROVIDER_SITE_OTHER): Payer: No Typology Code available for payment source | Admitting: Urology

## 2019-02-25 ENCOUNTER — Encounter: Payer: Self-pay | Admitting: Urology

## 2019-02-25 VITALS — BP 159/84 | HR 76 | Ht 72.0 in | Wt 232.4 lb

## 2019-02-25 DIAGNOSIS — R3129 Other microscopic hematuria: Secondary | ICD-10-CM

## 2019-02-25 LAB — MICROSCOPIC EXAMINATION
Bacteria, UA: NONE SEEN
Epithelial Cells (non renal): NONE SEEN /hpf (ref 0–10)
RBC, Urine: >30 /hpf — AB (ref 0–2)
WBC, UA: NONE SEEN /hpf (ref 0–5)

## 2019-02-25 LAB — URINALYSIS, COMPLETE
Bilirubin, UA: NEGATIVE
Glucose, UA: NEGATIVE
Ketones, UA: NEGATIVE
Leukocytes,UA: NEGATIVE
Nitrite, UA: NEGATIVE
Specific Gravity, UA: 1.015 (ref 1.005–1.030)
Urobilinogen, Ur: 0.2 mg/dL (ref 0.2–1.0)
pH, UA: 7 (ref 5.0–7.5)

## 2019-02-25 NOTE — Patient Instructions (Signed)
Cystoscopy Cystoscopy is a procedure that is used to help diagnose and sometimes treat conditions that affect the lower urinary tract. The lower urinary tract includes the bladder and the urethra. The urethra is the tube that drains urine from the bladder. Cystoscopy is done using a thin, tube-shaped instrument with a light and camera at the end (cystoscope). The cystoscope may be hard or flexible, depending on the goal of the procedure. The cystoscope is inserted through the urethra, into the bladder. Cystoscopy may be recommended if you have:  Urinary tract infections that keep coming back.  Blood in the urine (hematuria).  An inability to control when you urinate (urinary incontinence) or an overactive bladder.  Unusual cells found in a urine sample.  A blockage in the urethra, such as a urinary stone.  Painful urination.  An abnormality in the bladder found during an intravenous pyelogram (IVP) or CT scan. Cystoscopy may also be done to remove a sample of tissue to be examined under a microscope (biopsy). Tell a health care provider about:  Any allergies you have.  All medicines you are taking, including vitamins, herbs, eye drops, creams, and over-the-counter medicines.  Any problems you or family members have had with anesthetic medicines.  Any blood disorders you have.  Any surgeries you have had.  Any medical conditions you have.  Whether you are pregnant or may be pregnant. What are the risks? Generally, this is a safe procedure. However, problems may occur, including:  Infection.  Bleeding.  Allergic reactions to medicines.  Damage to other structures or organs. What happens before the procedure?  Ask your health care provider about: ? Changing or stopping your regular medicines. This is especially important if you are taking diabetes medicines or blood thinners. ? Taking medicines such as aspirin and ibuprofen. These medicines can thin your blood. Do not take  these medicines unless your health care provider tells you to take them. ? Taking over-the-counter medicines, vitamins, herbs, and supplements.  Follow instructions from your health care provider about eating or drinking restrictions.  Ask your health care provider what steps will be taken to help prevent infection. These may include: ? Washing skin with a germ-killing soap. ? Taking antibiotic medicine.  You may have an exam or testing, such as: ? X-rays of the bladder, urethra, or kidneys. ? Urine tests to check for signs of infection.  Plan to have someone take you home from the hospital or clinic. What happens during the procedure?   You will be given one or more of the following: ? A medicine to help you relax (sedative). ? A medicine to numb the area (local anesthetic).  The area around the opening of your urethra will be cleaned.  The cystoscope will be passed through your urethra into your bladder.  Germ-free (sterile) fluid will flow through the cystoscope to fill your bladder. The fluid will stretch your bladder so that your health care provider can clearly examine your bladder walls.  Your doctor will look at the urethra and bladder. Your doctor may take a biopsy or remove stones.  The cystoscope will be removed, and your bladder will be emptied. The procedure may vary among health care providers and hospitals. What can I expect after the procedure? After the procedure, it is common to have:  Some soreness or pain in your abdomen and urethra.  Urinary symptoms. These include: ? Mild pain or burning when you urinate. Pain should stop within a few minutes after you urinate. This   may last for up to 1 week. ? A small amount of blood in your urine for several days. ? Feeling like you need to urinate but producing only a small amount of urine. Follow these instructions at home: Medicines  Take over-the-counter and prescription medicines only as told by your health care  provider.  If you were prescribed an antibiotic medicine, take it as told by your health care provider. Do not stop taking the antibiotic even if you start to feel better. General instructions  Return to your normal activities as told by your health care provider. Ask your health care provider what activities are safe for you.  Do not drive for 24 hours if you were given a sedative during your procedure.  Watch for any blood in your urine. If the amount of blood in your urine increases, call your health care provider.  Follow instructions from your health care provider about eating or drinking restrictions.  If a tissue sample was removed for testing (biopsy) during your procedure, it is up to you to get your test results. Ask your health care provider, or the department that is doing the test, when your results will be ready.  Drink enough fluid to keep your urine pale yellow.  Keep all follow-up visits as told by your health care provider. This is important. Contact a health care provider if you:  Have pain that gets worse or does not get better with medicine, especially pain when you urinate.  Have trouble urinating.  Have more blood in your urine. Get help right away if you:  Have blood clots in your urine.  Have abdominal pain.  Have a fever or chills.  Are unable to urinate. Summary  Cystoscopy is a procedure that is used to help diagnose and sometimes treat conditions that affect the lower urinary tract.  Cystoscopy is done using a thin, tube-shaped instrument with a light and camera at the end.  After the procedure, it is common to have some soreness or pain in your abdomen and urethra.  Watch for any blood in your urine. If the amount of blood in your urine increases, call your health care provider.  If you were prescribed an antibiotic medicine, take it as told by your health care provider. Do not stop taking the antibiotic even if you start to feel better. This  information is not intended to replace advice given to you by your health care provider. Make sure you discuss any questions you have with your health care provider. Document Released: 06/14/2000 Document Revised: 06/09/2018 Document Reviewed: 06/09/2018 [employer] Patient Education  2020 [employer]  

## 2019-02-25 NOTE — Progress Notes (Signed)
02/25/2019 9:17 AM   Scott Holloway 11/14/1965 [id number]  Referring provider: Margo Common, [address] [place],  [place] [address]  Chief Complaint  Patient presents with  . Hematuria    HPI: 53 y.o. male presents for follow-up.  I saw him last year for an elevated PSA at 9.3.  A repeat PSA was normal at 2.5.  His most recent PSA June 2020 was 3.7.  He has been noted on 2 occasions to have 3+ blood on dipstick however microscopy was not performed.  Denies gross hematuria.  He has noted increased urinary frequency.  He denies flank, abdominal, pelvic or scrotal pain.  Denies present or prior tobacco history.   PMH: Past Medical History:  Diagnosis Date  . Graves disease 11/03/2017  . Hyperlipidemia 11/03/2017  . Hypertension 11/03/2017  . Hypothyroidism 11/03/2017  . Pneumothorax 1997    Surgical History: No past surgical history on file.  Home Medications:  Allergies as of 02/25/2019      Reactions   Poison Oak Extract Rash      Medication List       Accurate as of February 25, 2019  9:17 AM. If you have any questions, ask your nurse or doctor.        STOP taking these medications   tamsulosin 0.4 MG Caps capsule Commonly known as: FLOMAX Stopped by: Abbie Sons, MD     TAKE these medications   Fish Oil 1000 MG Caps Take by mouth.   levothyroxine 150 MCG tablet Commonly known as: SYNTHROID Take 1 tablet (150 mcg total) by mouth daily.   lisinopril 10 MG tablet Commonly known as: ZESTRIL Take 1 tablet (10 mg total) by mouth daily.   naproxen 500 MG tablet Commonly known as: NAPROSYN TAKE 1 TABLET BY MOUTH TWICE DAILY WITH A MEAL   pravastatin 40 MG tablet Commonly known as: PRAVACHOL Take 1 tablet (40 mg total) by mouth daily.       Allergies:  Allergies  Allergen Reactions  . Poison Oak Extract Rash    Family History: Family History  Problem Relation Age of Onset  . Hypertension Mother   . Aneurysm Mother   . Heart attack  Mother   . Lung cancer Father   . Healthy Sister   . Hypertension Brother   . Healthy Son   . Healthy Daughter   . Hypertension Maternal Grandmother   . Aneurysm Maternal Grandfather   . Alzheimer's disease Paternal Grandmother     Social History:  reports that he has never smoked. He has never used smokeless tobacco. He reports that he does not drink alcohol or use drugs.  ROS: UROLOGY Frequent Urination?: No Hard to postpone urination?: No Burning/pain with urination?: No Get up at night to urinate?: No Leakage of urine?: No Urine stream starts and stops?: No Trouble starting stream?: No Do you have to strain to urinate?: No Blood in urine?: No Urinary tract infection?: No Sexually transmitted disease?: No Injury to kidneys or bladder?: No Painful intercourse?: No Weak stream?: No Erection problems?: No Penile pain?: No  Gastrointestinal Nausea?: No Vomiting?: No Indigestion/heartburn?: No Diarrhea?: No Constipation?: No  Constitutional Fever: No Night sweats?: No Weight loss?: No Fatigue?: No  Skin Skin rash/lesions?: No Itching?: No  Eyes Blurred vision?: No Double vision?: No  Ears/Nose/Throat Sore throat?: No Sinus problems?: No  Hematologic/Lymphatic Swollen glands?: No Easy bruising?: No  Cardiovascular Leg swelling?: No Chest pain?: No  Respiratory Cough?: No Shortness of  breath?: No  Endocrine Excessive thirst?: No  Musculoskeletal Back pain?: No Joint pain?: No  Neurological Headaches?: No Dizziness?: No  Psychologic Depression?: No Anxiety?: No  Physical Exam: BP (!) 159/84 (BP Location: Left Arm, Patient Position: Sitting, Cuff Size: Normal)   Pulse 76   Ht 6' (1.829 m)   Wt 232 lb 6.4 oz (105.4 kg)   BMI 31.52 kg/m   Constitutional:  Alert and oriented, No acute distress. HEENT: [place] AT, moist mucus membranes.  Trachea midline, no masses. Cardiovascular: No clubbing, cyanosis, or edema. Respiratory: Normal  respiratory effort, no increased work of breathing. GI: Abdomen is soft, nontender, nondistended, no abdominal masses GU: No CVA tenderness Lymph: No cervical or inguinal lymphadenopathy. Skin: No rashes, bruises or suspicious lesions. Neurologic: Grossly intact, no focal deficits, moving all 4 extremities. Psychiatric: Normal mood and affect.  Laboratory Data:  Urinalysis Dipstick 2+ blood/1+ protein Microscopy: >30 RBC  Assessment & Plan:    - Microscopic hematuria  High risk microhematuria.  I discussed the standard evaluation and have recommended CT abdomen pelvis/cystoscopy.  He has developed chronic kidney disease with a creatinine of 1.5.  We will order a noncontrast CT and he may need nephrology evaluation, MRI or retrograde pyelograms if his evaluation is negative.  All questions were answered and he desires to proceed.   Riki AltesScott C Stoioff, MD  [facility] [address], [address] [place], [place] [address] [phone]

## 2019-03-01 ENCOUNTER — Encounter: Payer: Self-pay | Admitting: Urology

## 2019-03-19 ENCOUNTER — Ambulatory Visit
Admission: RE | Admit: 2019-03-19 | Discharge: 2019-03-19 | Disposition: A | Discharge status: Home / Self Care | Payer: No Typology Code available for payment source | Source: Ambulatory Visit | Attending: Urology | Admitting: Urology

## 2019-03-19 ENCOUNTER — Other Ambulatory Visit: Payer: Self-pay

## 2019-03-19 DIAGNOSIS — R3129 Other microscopic hematuria: Secondary | ICD-10-CM | POA: Diagnosis not present

## 2019-03-22 ENCOUNTER — Ambulatory Visit (INDEPENDENT_AMBULATORY_CARE_PROVIDER_SITE_OTHER): Payer: No Typology Code available for payment source | Admitting: Urology

## 2019-03-22 ENCOUNTER — Other Ambulatory Visit: Payer: Self-pay

## 2019-03-22 ENCOUNTER — Encounter: Payer: Self-pay | Admitting: Urology

## 2019-03-22 VITALS — BP 150/82 | HR 67 | Ht 72.0 in | Wt 232.0 lb

## 2019-03-22 DIAGNOSIS — R972 Elevated prostate specific antigen [PSA]: Secondary | ICD-10-CM | POA: Diagnosis not present

## 2019-03-22 DIAGNOSIS — R3129 Other microscopic hematuria: Secondary | ICD-10-CM

## 2019-03-22 DIAGNOSIS — N2889 Other specified disorders of kidney and ureter: Secondary | ICD-10-CM

## 2019-03-22 LAB — URINALYSIS, COMPLETE
Bilirubin, UA: NEGATIVE
Glucose, UA: NEGATIVE
Ketones, UA: NEGATIVE
Leukocytes,UA: NEGATIVE
Nitrite, UA: NEGATIVE
Specific Gravity, UA: 1.025 (ref 1.005–1.030)
Urobilinogen, Ur: 1 mg/dL (ref 0.2–1.0)
pH, UA: 6.5 (ref 5.0–7.5)

## 2019-03-22 LAB — MICROSCOPIC EXAMINATION
Bacteria, UA: NONE SEEN
Epithelial Cells (non renal): NONE SEEN /hpf (ref 0–10)

## 2019-03-22 MED ORDER — LIDOCAINE HCL URETHRAL/MUCOSAL 2 % EX GEL
1.0000 "application " | Freq: Once | CUTANEOUS | Status: AC
  Administered 2019-03-22: 1 via URETHRAL

## 2019-03-28 ENCOUNTER — Encounter: Payer: Self-pay | Admitting: Urology

## 2019-03-28 NOTE — Progress Notes (Signed)
   03/28/19  CC:  Chief Complaint  Patient presents with  . Cysto   Indications: Asymptomatic microhematuria  HPI: Refer to prior note of 02/25/2019  Blood pressure (!) 150/82, pulse 67, height 6' (1.829 m), weight 232 lb (105.2 kg). NED. A&Ox3.   No respiratory distress   Abd soft, NT, ND Normal phallus with bilateral descended testicles  Imaging: CT performed 03/19/2019 showed indeterminate hypodense lesions in each kidney.  This was a noncontrast study due to CKD.   Cystoscopy Procedure Note  Patient identification was confirmed, informed consent was obtained, and patient was prepped using Betadine solution.  Lidocaine jelly was administered per urethral meatus.     Pre-Procedure: - Inspection reveals a normal caliber ureteral meatus.  Procedure: The flexible cystoscope was introduced without difficulty - No urethral strictures/lesions are present. - Mild lateral lobe prostate; positive hypervascularity - Normal bladder neck - Bilateral ureteral orifices identified - Bladder mucosa  reveals no ulcers, tumors, or lesions - No bladder stones - No trabeculation  Retroflexion shows no abnormalities   Post-Procedure: - Patient tolerated the procedure well  Assessment/ Plan: No significant abnormalities noted on cystoscopy.  Indeterminate renal masses on noncontrast CT.  Further evaluation recommended with renal mass protocol MRI.  He will be notified with results  Return in about 6 months (around 09/19/2019) for Recheck with Larene Beach.  Abbie Sons, MD

## 2019-03-29 ENCOUNTER — Telehealth: Payer: Self-pay

## 2019-03-29 NOTE — Telephone Encounter (Signed)
Called pt, wife answers. Informed wife of information below as she is listed on DPR. She states that she will make pt aware.

## 2019-03-29 NOTE — Telephone Encounter (Signed)
-----   Message from Abbie Sons, MD sent at 03/28/2019  9:11 AM EDT ----- CT report did show indistinct lesions in each kidney which most likely represent cyst however radiology recommended further imaging with MRI.  Order was entered and will call with results

## 2019-04-16 ENCOUNTER — Ambulatory Visit: Payer: No Typology Code available for payment source

## 2019-04-29 ENCOUNTER — Ambulatory Visit: Payer: No Typology Code available for payment source

## 2019-05-18 ENCOUNTER — Other Ambulatory Visit: Payer: Self-pay

## 2019-05-18 ENCOUNTER — Ambulatory Visit
Admission: RE | Admit: 2019-05-18 | Discharge: 2019-05-18 | Disposition: A | Discharge status: Home / Self Care | Payer: No Typology Code available for payment source | Source: Ambulatory Visit | Attending: Urology | Admitting: Urology

## 2019-05-18 DIAGNOSIS — N2889 Other specified disorders of kidney and ureter: Secondary | ICD-10-CM | POA: Diagnosis present

## 2019-05-18 LAB — POCT I-STAT CREATININE: Creatinine, Ser: 1.5 mg/dL — ABNORMAL HIGH (ref 0.61–1.24)

## 2019-05-18 MED ORDER — GADOBUTROL 1 MMOL/ML IV SOLN
10.0000 mL | Freq: Once | INTRAVENOUS | Status: AC | PRN
  Administered 2019-05-18: 10 mL via INTRAVENOUS

## 2019-05-24 ENCOUNTER — Telehealth: Payer: Self-pay

## 2019-05-24 NOTE — Telephone Encounter (Signed)
Left patient mess to call 

## 2019-05-24 NOTE — Telephone Encounter (Signed)
-----   Message from Abbie Sons, MD sent at 05/23/2019  7:05 PM EST ----- Abdominal MRI showed benign renal cysts and no abnormalities suspicious for renal cancer

## 2019-05-25 NOTE — Telephone Encounter (Signed)
Left pt a mess to call 

## 2019-06-01 NOTE — Telephone Encounter (Signed)
Unable to reach patient mailed letter to notify

## 2019-09-20 ENCOUNTER — Ambulatory Visit: Payer: No Typology Code available for payment source | Attending: Internal Medicine

## 2019-09-20 DIAGNOSIS — Z23 Encounter for immunization: Secondary | ICD-10-CM

## 2019-09-20 NOTE — Progress Notes (Signed)
   Covid-19 Vaccination Clinic  Name:  Scott Holloway    MRN: [id number] DOB: February 28, 1966  09/20/2019  Scott Holloway was observed post Covid-19 immunization for 15 minutes without incident. He was provided with Vaccine Information Sheet and instruction to access the V-Safe system.   Scott Holloway was instructed to call 911 with any severe reactions post vaccine: Marland Kitchen Difficulty breathing  . Swelling of face and throat  . A fast heartbeat  . A bad rash all over body  . Dizziness and weakness   Immunizations Administered    Name Date Dose VIS Date Route   Pfizer COVID-19 Vaccine 09/20/2019  8:43 AM 0.3 mL 06/11/2019 Intramuscular   Manufacturer: [employer], [employer]   Lot: [id number]   NDC: [address]-4

## 2019-09-21 ENCOUNTER — Ambulatory Visit: Payer: No Typology Code available for payment source | Admitting: Urology

## 2019-09-21 ENCOUNTER — Encounter: Payer: Self-pay | Admitting: Urology

## 2019-10-01 ENCOUNTER — Ambulatory Visit
Admission: RE | Admit: 2019-10-01 | Discharge: 2019-10-01 | Disposition: A | Discharge status: Home / Self Care | Payer: Self-pay | Source: Ambulatory Visit | Attending: Family Medicine | Admitting: Family Medicine

## 2019-10-01 ENCOUNTER — Ambulatory Visit: Admission: RE | Admit: 2019-10-01 | Payer: Self-pay | Source: Ambulatory Visit

## 2019-10-01 ENCOUNTER — Other Ambulatory Visit: Payer: Self-pay

## 2019-10-01 ENCOUNTER — Other Ambulatory Visit: Payer: Self-pay | Admitting: Family Medicine

## 2019-10-01 DIAGNOSIS — S8990XA Unspecified injury of unspecified lower leg, initial encounter: Secondary | ICD-10-CM | POA: Insufficient documentation

## 2019-10-13 ENCOUNTER — Ambulatory Visit: Payer: Self-pay | Attending: Internal Medicine

## 2019-10-13 DIAGNOSIS — Z23 Encounter for immunization: Secondary | ICD-10-CM

## 2019-10-13 NOTE — Progress Notes (Signed)
   Covid-19 Vaccination Clinic  Name:  Scott Holloway    MRN: [id number] DOB: Jul 29, 1965  10/13/2019  [name] was observed post Covid-19 immunization for 15 minutes without incident. He was provided with Vaccine Information Sheet and instruction to access the V-Safe system.   Mr. Bones was instructed to call 911 with any severe reactions post vaccine: Marland Kitchen Difficulty breathing  . Swelling of face and throat  . A fast heartbeat  . A bad rash all over body  . Dizziness and weakness   Immunizations Administered    Name Date Dose VIS Date Route   Pfizer COVID-19 Vaccine 10/13/2019 10:50 AM 0.3 mL 06/11/2019 Intramuscular   Manufacturer: [employer], [employer]   Lot: [id number]   NDC: [address]-3

## 2019-11-15 ENCOUNTER — Telehealth: Payer: Self-pay

## 2019-11-15 NOTE — Telephone Encounter (Signed)
Copied from CRM [id number]. Topic: Appointment Scheduling - Scheduling Inquiry for Clinic >> Nov 15, 2019  1:27 PM Randol Kern wrote: Reason for CRM: Pt is requesting to have his thyroid checked, please advise  Best contact: [phone]

## 2019-11-15 NOTE — Telephone Encounter (Signed)
Patient given an appointment.

## 2019-11-17 NOTE — Progress Notes (Signed)
Established patient visit   Patient: Scott Holloway   DOB: [birth year]   54 y.o. Male  MRN: [id number] Visit Date: 11/18/2019  Today's healthcare provider: Dortha Kern, PA   Chief Complaint  Patient presents with  . Hypothyroidism  . Hypertension  . Hyperlipidemia   I,Latasha Walston,acting as a Neurosurgeon for [employer], [employer].,have documented all relevant documentation on the behalf of [employer], [employer],as directed by  [employer], [employer] while in the presence of [employer], [place].  Subjective    HPI Hypothyroid, follow-up  Lab Results  Component Value Date   TSH 4.180 12/10/2018   TSH 3.120 11/04/2017   T4TOTAL 6.7 12/10/2018   T4TOTAL 9.5 11/04/2017   Wt Readings from Last 3 Encounters:  11/18/19 230 lb 9.6 oz (104.6 kg)  03/22/19 232 lb (105.2 kg)  02/25/19 232 lb 6.4 oz (105.4 kg)    He was last seen for hypothyroid 11 months ago.  Management since that visit includes  No changes, continued on Levothyroxine. He reports good compliance with treatment. He is not having side effects.   Symptoms: No change in energy level No constipation  No diarrhea No heat / cold intolerance  No nervousness No palpitations  No weight changes    ----------------------------------------------------------------------------------------- Hypertension, follow-up  BP Readings from Last 3 Encounters:  11/18/19 130/80  03/22/19 (!) 150/82  02/25/19 (!) 159/84   Wt Readings from Last 3 Encounters:  11/18/19 230 lb 9.6 oz (104.6 kg)  03/22/19 232 lb (105.2 kg)  02/25/19 232 lb 6.4 oz (105.4 kg)     He was last seen for hypertension 11 months ago.  BP at that visit was 160/84. Management since that visit includes patient advised to check BP at home daily and call report of readings.  He reports fair compliance with treatment. He is not having side effects.  He is following a Regular diet. He is not exercising. He does not smoke.  Use of agents associated with  hypertension: thyroid hormones.   Outside blood pressures are being checked at home. Readings are elevated. Symptoms: No chest pain No chest pressure  No palpitations No syncope  No dyspnea No orthopnea  No paroxysmal nocturnal dyspnea No lower extremity edema   Pertinent labs: Lab Results  Component Value Date   CHOL 271 (H) 12/10/2018   HDL 47 12/10/2018   LDLCALC 182 (H) 12/10/2018   TRIG 212 (H) 12/10/2018   CHOLHDL 5.8 (H) 12/10/2018   Lab Results  Component Value Date   NA 144 01/14/2019   K 4.4 01/14/2019   CREATININE 1.50 (H) 05/18/2019   GFRNONAA 52 (L) 01/14/2019   GFRAA 60 01/14/2019   GLUCOSE 89 01/14/2019     The 10-year ASCVD risk score Denman George DC Jr., et al., 2013) is: 13%   --------------------------------------------------------------------------------------------------- Lipid/Cholesterol, Follow-up  Last lipid panel Other pertinent labs  Lab Results  Component Value Date   CHOL 271 (H) 12/10/2018   HDL 47 12/10/2018   LDLCALC 182 (H) 12/10/2018   TRIG 212 (H) 12/10/2018   CHOLHDL 5.8 (H) 12/10/2018   Lab Results  Component Value Date   ALT 12 12/10/2018   AST 22 12/10/2018   PLT 248 12/10/2018   TSH 4.180 12/10/2018     He was last seen for this 11 months ago.  Management since that visit includes advised to increase Pravastatin to 40 mg.  He reports good compliance with treatment. He is not having side effects.   Symptoms: No chest  pain No chest pressure/discomfort  No dyspnea No lower extremity edema  No numbness or tingling of extremity No orthopnea  No palpitations No paroxysmal nocturnal dyspnea  No speech difficulty No syncope   Current diet: well balanced Current exercise: none  The 10-year ASCVD risk score Denman George DC Montez Hageman., et al., 2013) is: 13%  ---------------------------------------------------------------------------------------------------      Past Medical History:  Diagnosis Date  . Graves disease 11/03/2017  .  Hyperlipidemia 11/03/2017  . Hypertension 11/03/2017  . Hypothyroidism 11/03/2017  . Pneumothorax 1997   Social History   Tobacco Use  . Smoking status: Never Smoker  . Smokeless tobacco: Never Used  Substance Use Topics  . Alcohol use: Never  . Drug use: Never   Family Status  Relation Name Status  . Mother  Alive  . Father  Deceased  . Sister  Alive  . Brother  Alive       step brother  . Son  Alive  . Daughter  Alive  . MGM  Deceased  . MGF  Deceased  . PGM  Deceased  . PGF  Deceased       accidental death-froze to death    Allergies  Allergen Reactions  . Poison Oak Extract Rash       Medications: Outpatient Medications Prior to Visit  Medication Sig  . levothyroxine (SYNTHROID) 150 MCG tablet Take 1 tablet (150 mcg total) by mouth daily.  Marland Kitchen lisinopril (ZESTRIL) 10 MG tablet Take 1 tablet (10 mg total) by mouth daily.  . Omega-3 Fatty Acids (FISH OIL) 1000 MG CAPS Take by mouth.  . pravastatin (PRAVACHOL) 40 MG tablet Take 1 tablet (40 mg total) by mouth daily.  . [DISCONTINUED] naproxen (NAPROSYN) 500 MG tablet TAKE 1 TABLET BY MOUTH TWICE DAILY WITH A MEAL   No facility-administered medications prior to visit.    Review of Systems  Constitutional: Negative.   Respiratory: Negative.   Cardiovascular: Negative.   Musculoskeletal: Negative.       Objective    BP 130/80 (BP Location: Right Arm, Patient Position: Sitting, Cuff Size: Normal)   Pulse 62   Temp (!) 97.1 F (36.2 C) (Temporal)   Ht 6' (1.829 m)   Wt 230 lb 9.6 oz (104.6 kg)   BMI 31.27 kg/m    Physical Exam Constitutional:      Appearance: He is well-developed.  HENT:     Head: Normocephalic and atraumatic.     Right Ear: External ear normal.     Left Ear: External ear normal.     Nose: Nose normal.  Eyes:     General:        Right eye: No discharge.     Conjunctiva/sclera: Conjunctivae normal.     Pupils: Pupils are equal, round, and reactive to light.  Neck:     Thyroid: No  thyromegaly.     Trachea: No tracheal deviation.  Cardiovascular:     Rate and Rhythm: Normal rate and regular rhythm.     Heart sounds: Normal heart sounds. No murmur.  Pulmonary:     Effort: Pulmonary effort is normal. No respiratory distress.     Breath sounds: Normal breath sounds. No wheezing or rales.  Chest:     Chest wall: No tenderness.  Abdominal:     General: There is no distension.     Palpations: Abdomen is soft. There is no mass.     Tenderness: There is no abdominal tenderness. There is no guarding or rebound.  Musculoskeletal:        General: No tenderness. Normal range of motion.     Cervical back: Normal range of motion and neck supple.  Lymphadenopathy:     Cervical: No cervical adenopathy.  Skin:    General: Skin is warm and dry.     Findings: No erythema or rash.  Neurological:     Mental Status: He is alert and oriented to person, place, and time.     Cranial Nerves: No cranial nerve deficit.     Motor: No abnormal muscle tone.     Coordination: Coordination normal.     Deep Tendon Reflexes: Reflexes are normal and symmetric. Reflexes normal.  Psychiatric:        Behavior: Behavior normal.        Thought Content: Thought content normal.        Judgment: Judgment normal.                         Depression screen [facility] 2/9 11/18/2019 11/04/2017  Decreased Interest 0 0  Down, Depressed, Hopeless 0 0  PHQ - 2 Score 0 0  Altered sleeping - 1  Tired, decreased energy - 0  Change in appetite - 0  Feeling bad or failure about yourself  - 0  Trouble concentrating - 0  Moving slowly or fidgety/restless - 0  Suicidal thoughts - 0  PHQ-9 Score - 1  Difficult doing work/chores - Not difficult at all   Fall Risk  11/18/2019 11/04/2017  Falls in the past year? 0 No  Number falls in past yr: 0 -  Injury with Fall? 0 -     Functional Status Survey: Is the patient deaf or have difficulty hearing?: No Does the patient have difficulty seeing, even when  wearing glasses/contacts?: No Does the patient have difficulty concentrating, remembering, or making decisions?: No Does the patient have difficulty walking or climbing stairs?: No Does the patient have difficulty dressing or bathing?: No Does the patient have difficulty doing errands alone such as visiting a doctor's office or shopping?: No    No results found for any visits on 11/18/19.    Assessment & Plan     1. Hypothyroidism (acquired) Tolerating Levothyroxine 150 mcg qd. Hypothyroidism status post RAI treatment of Grave's Disease in 1997. Feeling well. Recheck thyroid panel and refill medications. - TSH+T4F+T3Free  2. Essential hypertension Well controlled BP on Lisinopril 10 mg qd without chest pains or palpitations. Recheck CMP, Lipid Panel and CBC. - Comprehensive metabolic panel - Lipid Panel With LDL/HDL Ratio - CBC with Differential/Platelet  3. Mixed hyperlipidemia Tolerating Pravastatin without muscle pains or joint aches. No significant weight changes. Continue low fat diet and recheck lipid panel. - Lipid Panel With LDL/HDL Ratio  4. Graves disease History of RAI treatment in 1997 for hyperthyroidism. No exophthalmos, tremor, palpitations or heat/cold intolerance. Recheck labs. - Comprehensive metabolic panel - CWC+B7S+E8BTDV - CBC with Differential/Platelet  5. Abnormal kidney function Follow up labs - Comprehensive metabolic panel - PSA  6. Need for tetanus booster - Tdap vaccine greater than or equal to 7yo IM  7. Colon cancer screening - Ambulatory referral to Gastroenterology  8. Prostate cancer screening No significant nocturia or hesitancy. - PSA   No follow-ups on file.         Vernie Murders, [place] [phone] (phone) [phone] (fax)  [place]

## 2019-11-18 ENCOUNTER — Encounter: Payer: Self-pay | Admitting: Family Medicine

## 2019-11-18 ENCOUNTER — Ambulatory Visit (INDEPENDENT_AMBULATORY_CARE_PROVIDER_SITE_OTHER): Payer: BC Managed Care – PPO | Admitting: Family Medicine

## 2019-11-18 ENCOUNTER — Other Ambulatory Visit: Payer: Self-pay

## 2019-11-18 VITALS — BP 130/80 | HR 62 | Temp 97.1°F | Ht 72.0 in | Wt 230.6 lb

## 2019-11-18 DIAGNOSIS — E0500 Thyrotoxicosis with diffuse goiter without thyrotoxic crisis or storm: Secondary | ICD-10-CM

## 2019-11-18 DIAGNOSIS — E782 Mixed hyperlipidemia: Secondary | ICD-10-CM

## 2019-11-18 DIAGNOSIS — I10 Essential (primary) hypertension: Secondary | ICD-10-CM

## 2019-11-18 DIAGNOSIS — Z125 Encounter for screening for malignant neoplasm of prostate: Secondary | ICD-10-CM

## 2019-11-18 DIAGNOSIS — E039 Hypothyroidism, unspecified: Secondary | ICD-10-CM | POA: Diagnosis not present

## 2019-11-18 DIAGNOSIS — N289 Disorder of kidney and ureter, unspecified: Secondary | ICD-10-CM | POA: Diagnosis not present

## 2019-11-18 DIAGNOSIS — Z1211 Encounter for screening for malignant neoplasm of colon: Secondary | ICD-10-CM

## 2019-11-18 DIAGNOSIS — Z23 Encounter for immunization: Secondary | ICD-10-CM

## 2019-11-18 MED ORDER — LEVOTHYROXINE SODIUM 150 MCG PO TABS: 150.0000 ug | ORAL_TABLET | Freq: Every day | ORAL | 3 refills | Status: DC

## 2019-11-18 MED ORDER — LISINOPRIL 10 MG PO TABS: 10.0000 mg | ORAL_TABLET | Freq: Every day | ORAL | 3 refills | Status: DC

## 2019-11-18 MED ORDER — PRAVASTATIN SODIUM 40 MG PO TABS: 40.0000 mg | ORAL_TABLET | Freq: Every day | ORAL | 3 refills | Status: DC

## 2019-11-19 LAB — CBC WITH DIFFERENTIAL/PLATELET
Basophils Absolute: 0 10*3/uL (ref 0.0–0.2)
Basos: 1 %
EOS (ABSOLUTE): 0.3 10*3/uL (ref 0.0–0.4)
Eos: 4 %
Hematocrit: 39.3 % (ref 37.5–51.0)
Hemoglobin: 12.9 g/dL — ABNORMAL LOW (ref 13.0–17.7)
Immature Grans (Abs): 0 10*3/uL (ref 0.0–0.1)
Immature Granulocytes: 0 %
Lymphocytes Absolute: 2.2 10*3/uL (ref 0.7–3.1)
Lymphs: 33 %
MCH: 29.6 pg (ref 26.6–33.0)
MCHC: 32.8 g/dL (ref 31.5–35.7)
MCV: 90 fL (ref 79–97)
Monocytes Absolute: 0.5 10*3/uL (ref 0.1–0.9)
Monocytes: 7 %
Neutrophils Absolute: 3.7 10*3/uL (ref 1.4–7.0)
Neutrophils: 55 %
Platelets: 228 10*3/uL (ref 150–450)
RBC: 4.36 x10E6/uL (ref 4.14–5.80)
RDW: 13.1 % (ref 11.6–15.4)
WBC: 6.6 10*3/uL (ref 3.4–10.8)

## 2019-11-19 LAB — PSA: Prostate Specific Ag, Serum: 2.9 ng/mL (ref 0.0–4.0)

## 2019-11-19 LAB — COMPREHENSIVE METABOLIC PANEL
ALT: 11 IU/L (ref 0–44)
AST: 21 IU/L (ref 0–40)
Albumin/Globulin Ratio: 1.3 (ref 1.2–2.2)
Albumin: 3.8 g/dL (ref 3.8–4.9)
Alkaline Phosphatase: 52 IU/L (ref 48–121)
BUN/Creatinine Ratio: 11 (ref 9–20)
BUN: 15 mg/dL (ref 6–24)
Bilirubin Total: 0.2 mg/dL (ref 0.0–1.2)
CO2: 26 mmol/L (ref 20–29)
Calcium: 9.3 mg/dL (ref 8.7–10.2)
Chloride: 103 mmol/L (ref 96–106)
Creatinine, Ser: 1.42 mg/dL — ABNORMAL HIGH (ref 0.76–1.27)
GFR calc Af Amer: 63 mL/min/{1.73_m2} (ref >59)
GFR calc non Af Amer: 55 mL/min/{1.73_m2} — ABNORMAL LOW (ref >59)
Globulin, Total: 2.9 g/dL (ref 1.5–4.5)
Glucose: 92 mg/dL (ref 65–99)
Potassium: 4.1 mmol/L (ref 3.5–5.2)
Sodium: 140 mmol/L (ref 134–144)
Total Protein: 6.7 g/dL (ref 6.0–8.5)

## 2019-11-19 LAB — LIPID PANEL WITH LDL/HDL RATIO
Cholesterol, Total: 226 mg/dL — ABNORMAL HIGH (ref 100–199)
HDL: 48 mg/dL (ref >39)
LDL Chol Calc (NIH): 152 mg/dL — ABNORMAL HIGH (ref 0–99)
LDL/HDL Ratio: 3.2 ratio (ref 0.0–3.6)
Triglycerides: 144 mg/dL (ref 0–149)
VLDL Cholesterol Cal: 26 mg/dL (ref 5–40)

## 2019-11-19 LAB — TSH+T4F+T3FREE
Free T4: 1.15 ng/dL (ref 0.82–1.77)
T3, Free: 2.6 pg/mL (ref 2.0–4.4)
TSH: 0.655 u[IU]/mL (ref 0.450–4.500)

## 2019-11-25 ENCOUNTER — Telehealth (INDEPENDENT_AMBULATORY_CARE_PROVIDER_SITE_OTHER): Payer: Self-pay | Admitting: Gastroenterology

## 2019-11-25 DIAGNOSIS — Z1211 Encounter for screening for malignant neoplasm of colon: Secondary | ICD-10-CM

## 2019-11-25 NOTE — Progress Notes (Signed)
Gastroenterology Pre-Procedure Review  Request Date: 12/17/19 Requesting Physician: Dr. Maximino Greenland  PATIENT REVIEW QUESTIONS: The patient responded to the following health history questions as indicated:    1. Are you having any GI issues? no 2. Do you have a personal history of Polyps? no 3. Do you have a family history of Colon Cancer or Polyps? no 4. Diabetes Mellitus? no 5. Joint replacements in the past 12 months?no 6. Major health problems in the past 3 months?no 7. Any artificial heart valves, MVP, or defibrillator?no    MEDICATIONS & ALLERGIES:    Patient reports the following regarding taking any anticoagulation/antiplatelet therapy:   Plavix, Coumadin, Eliquis, Xarelto, Lovenox, Pradaxa, Brilinta, or Effient? no Aspirin? no  Patient confirms/reports the following medications:  Current Outpatient Medications  Medication Sig Dispense Refill  . levothyroxine (SYNTHROID) 150 MCG tablet Take 1 tablet (150 mcg total) by mouth daily. 90 tablet 3  . lisinopril (ZESTRIL) 10 MG tablet Take 1 tablet (10 mg total) by mouth daily. 90 tablet 3  . Omega-3 Fatty Acids (FISH OIL) 1000 MG CAPS Take by mouth.    . pravastatin (PRAVACHOL) 40 MG tablet Take 1 tablet (40 mg total) by mouth daily. 90 tablet 3   No current facility-administered medications for this visit.    Patient confirms/reports the following allergies:  Allergies  Allergen Reactions  . Poison Oak Extract Rash    No orders of the defined types were placed in this encounter.   AUTHORIZATION INFORMATION Primary Insurance: 1D#: Group #:  Secondary Insurance: 1D#: Group #:  SCHEDULE INFORMATION: Date:  Time: Location:

## 2019-12-15 ENCOUNTER — Telehealth: Payer: Self-pay

## 2019-12-15 ENCOUNTER — Other Ambulatory Visit
Admission: RE | Admit: 2019-12-15 | Discharge: 2019-12-15 | Disposition: A | Discharge status: Home / Self Care | Payer: BC Managed Care – PPO | Source: Ambulatory Visit | Attending: Gastroenterology | Admitting: Gastroenterology

## 2019-12-15 ENCOUNTER — Other Ambulatory Visit: Payer: Self-pay

## 2019-12-15 DIAGNOSIS — Z20822 Contact with and (suspected) exposure to covid-19: Secondary | ICD-10-CM | POA: Insufficient documentation

## 2019-12-15 DIAGNOSIS — Z01812 Encounter for preprocedural laboratory examination: Secondary | ICD-10-CM | POA: Diagnosis not present

## 2019-12-15 LAB — SARS CORONAVIRUS 2 (TAT 6-24 HRS): SARS Coronavirus 2: NEGATIVE

## 2019-12-15 MED ORDER — PEG 3350-KCL-NABCB-NACL-NASULF 236 G PO SOLR
ORAL | 0 refills | Status: DC
Start: 2019-12-15 — End: 2020-04-19

## 2019-12-15 NOTE — Telephone Encounter (Signed)
Patient needs a new prescription for his prep since Suprep will cost him $90.

## 2019-12-16 ENCOUNTER — Encounter: Payer: Self-pay | Admitting: Gastroenterology

## 2019-12-17 ENCOUNTER — Ambulatory Visit: Payer: BC Managed Care – PPO | Admitting: Certified Registered Nurse Anesthetist

## 2019-12-17 ENCOUNTER — Encounter
Admission: RE | Disposition: A | Discharge status: Home / Self Care | Payer: Self-pay | Source: Home / Self Care | Attending: Gastroenterology

## 2019-12-17 ENCOUNTER — Other Ambulatory Visit: Payer: Self-pay

## 2019-12-17 ENCOUNTER — Ambulatory Visit
Admission: RE | Admit: 2019-12-17 | Discharge: 2019-12-17 | Disposition: A | Discharge status: Home / Self Care | Payer: BC Managed Care – PPO | Attending: Gastroenterology | Admitting: Gastroenterology

## 2019-12-17 ENCOUNTER — Encounter: Payer: Self-pay | Admitting: Gastroenterology

## 2019-12-17 DIAGNOSIS — Z79899 Other long term (current) drug therapy: Secondary | ICD-10-CM | POA: Insufficient documentation

## 2019-12-17 DIAGNOSIS — Z79890 Hormone replacement therapy (postmenopausal): Secondary | ICD-10-CM | POA: Insufficient documentation

## 2019-12-17 DIAGNOSIS — Z1211 Encounter for screening for malignant neoplasm of colon: Secondary | ICD-10-CM | POA: Insufficient documentation

## 2019-12-17 DIAGNOSIS — E785 Hyperlipidemia, unspecified: Secondary | ICD-10-CM | POA: Insufficient documentation

## 2019-12-17 DIAGNOSIS — I10 Essential (primary) hypertension: Secondary | ICD-10-CM | POA: Insufficient documentation

## 2019-12-17 DIAGNOSIS — E669 Obesity, unspecified: Secondary | ICD-10-CM | POA: Diagnosis not present

## 2019-12-17 DIAGNOSIS — E039 Hypothyroidism, unspecified: Secondary | ICD-10-CM | POA: Insufficient documentation

## 2019-12-17 DIAGNOSIS — E0500 Thyrotoxicosis with diffuse goiter without thyrotoxic crisis or storm: Secondary | ICD-10-CM | POA: Insufficient documentation

## 2019-12-17 HISTORY — PX: COLONOSCOPY WITH PROPOFOL: SHX5780

## 2019-12-17 SURGERY — COLONOSCOPY WITH PROPOFOL
Anesthesia: General

## 2019-12-17 MED ORDER — SODIUM CHLORIDE 0.9 % IV SOLN
INTRAVENOUS | Status: DC
  Administered 2019-12-17: 1000 mL via INTRAVENOUS

## 2019-12-17 MED ORDER — LIDOCAINE HCL (CARDIAC) PF 100 MG/5ML IV SOSY
PREFILLED_SYRINGE | INTRAVENOUS | Status: DC | PRN
  Administered 2019-12-17: 50 mg via INTRAVENOUS

## 2019-12-17 MED ORDER — PROPOFOL 10 MG/ML IV BOLUS
INTRAVENOUS | Status: DC | PRN
  Administered 2019-12-17: 70 mg via INTRAVENOUS

## 2019-12-17 MED ORDER — PROPOFOL 500 MG/50ML IV EMUL
INTRAVENOUS | Status: DC | PRN
  Administered 2019-12-17: 150 ug/kg/min via INTRAVENOUS

## 2019-12-17 NOTE — Anesthesia Preprocedure Evaluation (Signed)
Anesthesia Evaluation  Patient identified by MRN, date of birth, ID band Patient awake    Reviewed: Allergy & Precautions, NPO status , Patient's Chart, lab work & pertinent test results  History of Anesthesia Complications Negative for: history of anesthetic complications  Airway Mallampati: III  TM Distance: >3 FB Neck ROM: Full    Dental no notable dental hx.    Pulmonary neg pulmonary ROS, neg sleep apnea, neg COPD,    breath sounds clear to auscultation- rhonchi (-) wheezing      Cardiovascular Exercise Tolerance: Good hypertension, Pt. on medications (-) CAD, (-) Past MI, (-) Cardiac Stents and (-) CABG  Rhythm:Regular Rate:Normal - Systolic murmurs and - Diastolic murmurs    Neuro/Psych neg Seizures negative neurological ROS  negative psych ROS   GI/Hepatic negative GI ROS, Neg liver ROS,   Endo/Other  neg diabetesHypothyroidism   Renal/GU negative Renal ROS     Musculoskeletal negative musculoskeletal ROS (+)   Abdominal (+) + obese,   Peds  Hematology negative hematology ROS (+)   Anesthesia Other Findings Past Medical History: 11/03/2017: Graves disease 11/03/2017: Hyperlipidemia 11/03/2017: Hypertension 11/03/2017: Hypothyroidism 1997: Pneumothorax   Reproductive/Obstetrics                             Anesthesia Physical Anesthesia Plan  ASA: II  Anesthesia Plan: General   Post-op Pain Management:    Induction: Intravenous  PONV Risk Score and Plan: 1 and Propofol infusion  Airway Management Planned: Natural Airway  Additional Equipment:   Intra-op Plan:   Post-operative Plan:   Informed Consent: I have reviewed the patients History and Physical, chart, labs and discussed the procedure including the risks, benefits and alternatives for the proposed anesthesia with the patient or authorized representative who has indicated his/her understanding and acceptance.      Dental advisory given  Plan Discussed with: CRNA and Anesthesiologist  Anesthesia Plan Comments:         Anesthesia Quick Evaluation

## 2019-12-17 NOTE — H&P (Signed)
Melodie Bouillon, MD [address], [place], [place], [address] [address], [place], [place], [address] Phone: [phone]  Fax: [phone]  Primary Care Physician:  Tamsen Roers, PA   Pre-Procedure History & Physical: HPI:  Scott Holloway is a 54 y.o. male is here for a colonoscopy.   Past Medical History:  Diagnosis Date  . Graves disease 11/03/2017  . Hyperlipidemia 11/03/2017  . Hypertension 11/03/2017  . Hypothyroidism 11/03/2017  . Pneumothorax 1997    Past Surgical History:  Procedure Laterality Date  . COLONOSCOPY WITH PROPOFOL      Prior to Admission medications   Medication Sig Start Date End Date Taking? Authorizing Provider  levothyroxine (SYNTHROID) 150 MCG tablet Take 1 tablet (150 mcg total) by mouth daily. 11/18/19  Yes Chrismon, Jodell Cipro, PA  lisinopril (ZESTRIL) 10 MG tablet Take 1 tablet (10 mg total) by mouth daily. 11/18/19  Yes Chrismon, Jodell Cipro, PA  pravastatin (PRAVACHOL) 40 MG tablet Take 1 tablet (40 mg total) by mouth daily. 11/18/19  Yes Chrismon, Jodell Cipro, PA  Multiple Vitamin (MULTIVITAMIN ADULT PO) Take by mouth.    [provider]  Omega-3 Fatty Acids (FISH OIL) 1000 MG CAPS Take by mouth.    [provider]  polyethylene glycol (GOLYTELY) 236 g solution At 5 PM drink 8 oz glass of Golytely mixture every 15 minutes until you drink half of the jug. Then 5 hours before your procedure, drink 8 oz of mixture every 15 minutes until it is all gone. 12/15/19   Pasty Spillers, MD    Allergies as of 11/25/2019 - Review Complete 11/25/2019  Allergen Reaction Noted  . Poison oak extract Rash 02/01/2019    Family History  Problem Relation Age of Onset  . Hypertension Mother   . Aneurysm Mother   . Heart attack Mother   . Lung cancer Father   . Healthy Sister   . Hypertension Brother   . Healthy Son   . Healthy Daughter   . Hypertension Maternal Grandmother   . Aneurysm Maternal Grandfather   .  Alzheimer's disease Paternal Grandmother     Social History   Socioeconomic History  . Marital status: Married    Spouse name: Not on file  . Number of children: Not on file  . Years of education: Not on file  . Highest education level: Not on file  Occupational History  . Not on file  Tobacco Use  . Smoking status: Never Smoker  . Smokeless tobacco: Never Used  Vaping Use  . Vaping Use: Never used  Substance and Sexual Activity  . Alcohol use: Never  . Drug use: Never  . Sexual activity: Yes    Birth control/protection: None  Other Topics Concern  . Not on file  Social History Narrative  . Not on file   Social Determinants of Health   Financial Resource Strain:   . Difficulty of Paying Living Expenses:   Food Insecurity:   . Worried About Programme researcher, broadcasting/film/video in the Last Year:   . Barista in the Last Year:   Transportation Needs:   . Freight forwarder (Medical):   Marland Kitchen Lack of Transportation (Non-Medical):   Physical Activity:   . Days of Exercise per Week:   . Minutes of Exercise per Session:   Stress:   . Feeling of Stress :   Social Connections:   . Frequency of Communication with Friends and Family:   .  Frequency of Social Gatherings with Friends and Family:   . Attends Religious Services:   . Active Member of Clubs or Organizations:   . Attends Archivist Meetings:   Marland Kitchen Marital Status:   Intimate Partner Violence:   . Fear of Current or Ex-Partner:   . Emotionally Abused:   Marland Kitchen Physically Abused:   . Sexually Abused:     Review of Systems: See HPI, otherwise negative ROS  Physical Exam: BP 139/90   Pulse (!) 55   Temp (!) 96.8 F (36 C) (Tympanic)   Resp 18   Ht 6' (1.829 m)   Wt 104.3 kg   SpO2 98%   BMI 31.19 kg/m  General:   Alert,  pleasant and cooperative in NAD Head:  Normocephalic and atraumatic. Neck:  Supple; no masses or thyromegaly. Lungs:  Clear throughout to auscultation, normal respiratory effort.    Heart:   +S1, +S2, Regular rate and rhythm, No edema. Abdomen:  Soft, nontender and nondistended. Normal bowel sounds, without guarding, and without rebound.   Neurologic:  Alert and  oriented x4;  grossly normal neurologically.  Impression/Plan: Scott Holloway is here for a colonoscopy to be performed for average risk screening.  Risks, benefits, limitations, and alternatives regarding  colonoscopy have been reviewed with the patient.  Questions have been answered.  All parties agreeable.   Virgel Manifold, MD  12/17/2019, 8:13 AM

## 2019-12-17 NOTE — Transfer of Care (Signed)
Immediate Anesthesia Transfer of Care Note  Patient: Scott Holloway  Procedure(s) Performed: COLONOSCOPY WITH PROPOFOL (N/A )  Patient Location: Endoscopy Unit  Anesthesia Type:General  Level of Consciousness: drowsy  Airway & Oxygen Therapy: Patient Spontanous Breathing  Post-op Assessment: Report given to RN and Post -op Vital signs reviewed and stable  Post vital signs: Reviewed and stable  Last Vitals:  Vitals Value Taken Time  BP    Temp    Pulse    Resp    SpO2      Last Pain:  Vitals:   12/17/19 0748  TempSrc: Tympanic         Complications: No complications documented.

## 2019-12-17 NOTE — Anesthesia Postprocedure Evaluation (Signed)
Anesthesia Post Note  Patient: Scott Holloway  Procedure(s) Performed: COLONOSCOPY WITH PROPOFOL (N/A )  Patient location during evaluation: Endoscopy Anesthesia Type: General Level of consciousness: awake and alert and oriented Pain management: pain level controlled Vital Signs Assessment: post-procedure vital signs reviewed and stable Respiratory status: spontaneous breathing, nonlabored ventilation and respiratory function stable Cardiovascular status: blood pressure returned to baseline and stable Postop Assessment: no signs of nausea or vomiting Anesthetic complications: no   No complications documented.   Last Vitals:  Vitals:   12/17/19 0920 12/17/19 0930  BP: 121/77 121/77  Pulse: 65   Resp: 18 18  Temp:    SpO2: 100% 100%    Last Pain:  Vitals:   12/17/19 0850  TempSrc: Tympanic                 Yug Loria

## 2019-12-17 NOTE — Op Note (Signed)
[facility] Patient Name: Scott Holloway Procedure Date: 12/17/2019 7:46 AM MRN: [id number] Account #: [id number] Date of Birth: 20-Aug-1965 Admit Type: Outpatient Age: 54 Room: [facility] ENDO ROOM 4 Gender: Male Note Status: Finalized Procedure:             Colonoscopy Indications:           Screening for colorectal malignant neoplasm Providers:             Wiatt Mahabir B. Maximino Greenland MD, MD Referring MD:          Jodell Cipro. Chrismon, MD (Referring MD) Medicines:             Monitored Anesthesia Care Complications:         No immediate complications. Procedure:             Pre-Anesthesia Assessment:                        - Prior to the procedure, a History and Physical was                         performed, and patient medications, allergies and                         sensitivities were reviewed. The patient's tolerance                         of previous anesthesia was reviewed.                        - The risks and benefits of the procedure and the                         sedation options and risks were discussed with the                         patient. All questions were answered and informed                         consent was obtained.                        - Patient identification and proposed procedure were                         verified prior to the procedure by the physician, the                         nurse, the anesthetist and the technician. The                         procedure was verified in the pre-procedure area in                         the procedure room in the endoscopy suite.                        - ASA Grade Assessment: II - A patient with mild  systemic disease.                        - After reviewing the risks and benefits, the patient                         was deemed in satisfactory condition to undergo the                         procedure.                        After obtaining informed consent, the  colonoscope was                         passed under direct vision. Throughout the procedure,                         the patient's blood pressure, pulse, and oxygen                         saturations were monitored continuously. The                         Colonoscope was introduced through the anus and                         advanced to the the cecum, identified by appendiceal                         orifice and ileocecal valve. The colonoscopy was                         performed with ease. The patient tolerated the                         procedure well. The quality of the bowel preparation                         was good. Findings:      The perianal and digital rectal examinations were normal.      The rectum, sigmoid colon, descending colon, transverse colon, ascending       colon and cecum appeared normal.      The retroflexed view of the distal rectum and anal verge was normal and       showed no anal or rectal abnormalities. Impression:            - The rectum, sigmoid colon, descending colon,                         transverse colon, ascending colon and cecum are normal.                        - The distal rectum and anal verge are normal on                         retroflexion view.                        - No  specimens collected. Recommendation:        - Discharge patient to home.                        - Resume previous diet.                        - Continue present medications.                        - Repeat colonoscopy in 10 years for screening                         purposes.                        - Return to primary care physician as previously                         scheduled.                        - The findings and recommendations were discussed with                         the patient.                        - The findings and recommendations were discussed with                         the patient's family.                        - In the future, if  patient develops new symptoms such                         as blood per rectum, abdominal pain, weight loss,                         altered bowel habits or any other reason for concern,                         patient should discuss this with thier PCP as they may                         need a GI referral at that time or evaluation for need                         for colonoscopy earlier than the recommended screening                         colonoscopy.                        In addition, if patient's family history of colon                         cancer changes (no family history at this time) in the  future, earlier screening may be indicated and patient                         should discuss this with PCP as well. Procedure Code(s):     --- Professional ---                        [phone], Colonoscopy, flexible; diagnostic, including                         collection of specimen(s) by brushing or washing, when                         performed (separate procedure) Diagnosis Code(s):     --- Professional ---                        Z12.11, Encounter for screening for malignant neoplasm                         of colon CPT copyright 2019 American Medical Association. All rights reserved. The codes documented in this report are preliminary and upon coder review may  be revised to meet current compliance requirements.  Vonda Antigua, MD Margretta Sidle B. Bonna Gains MD, MD 12/17/2019 8:54:58 AM This report has been signed electronically. Number of Addenda: 0 Note Initiated On: 12/17/2019 7:46 AM Scope Withdrawal Time: 0 hours 22 minutes 26 seconds  Total Procedure Duration: 0 hours 25 minutes 49 seconds       [facility]

## 2019-12-20 ENCOUNTER — Encounter: Payer: Self-pay | Admitting: Gastroenterology

## 2020-04-01 DIAGNOSIS — M7662 Achilles tendinitis, left leg: Secondary | ICD-10-CM | POA: Diagnosis not present

## 2020-04-10 ENCOUNTER — Telehealth: Payer: Self-pay | Admitting: Urology

## 2020-04-10 NOTE — Telephone Encounter (Signed)
Please have the patient to schedule an appointment for follow up for micro heme.

## 2020-04-18 NOTE — Progress Notes (Signed)
04/19/2020 11:30 AM   Scott Holloway 19-Aug-1965 [id number]  Referring provider: Tamsen Roers, PA [address] [place],  [place] [address]  Chief Complaint  Patient presents with  . Benign Prostatic Hypertrophy    HPI: Scott Holloway is a 54 y.o. male with high risk hematuria, elevated PSA and BPH with LU TS who presents today for follow up.  High risk hematuria > 30 RBC's on UA.  Non smoker.  Non contrast CT 03/2019 Both adrenal glands appear normal. There are indistinct hypodense lesions of both kidneys. One index lesion in the left mid kidney measures 2.4 by 1.6 cm on image 69/4 with internal density 13 Hounsfield units. No urinary tract calculi identified. No hydronephrosis or hydroureter. Urinary bladder unremarkable.  Upper normal size of the prostate gland.  MRI 05/2019 Multiple small benign-appearing bilateral renal cysts. No evidence of renal neoplasm or other significant abnormality.   Cysto 03/2019 with Dr. Lonna Cobb NED.  No reports of gross hematuria.  UA today > 30 RBC's.   Elevated PSA Component     Latest Ref Rng & Units 11/04/2017 01/14/2018 12/10/2018 11/18/2019  Prostate Specific Ag, Serum     0.0 - 4.0 ng/mL 9.3 (H) 2.5 3.7 2.9    BPH WITH LUTS  (prostate and/or bladder) IPSS score: 4/1     Major complaint(s): None.  Denies any dysuria, hematuria or suprapubic pain.    Denies any recent fevers, chills, nausea or vomiting.  He has a maternal/paternal cousin with prostate cancer     IPSS    Row Name 04/19/20 1100         International Prostate Symptom Score   How often have you had the sensation of not emptying your bladder? Not at All     How often have you had to urinate less than every two hours? About half the time     How often have you found you stopped and started again several times when you urinated? Not at All     How often have you found it difficult to postpone urination? Not at All     How often have you had a weak urinary stream? Not  at All     How often have you had to strain to start urination? Not at All     How many times did you typically get up at night to urinate? 1 Time     Total IPSS Score 4       Quality of Life due to urinary symptoms   If you were to spend the rest of your life with your urinary condition just the way it is now how would you feel about that? Pleased            Score:  1-7 Mild 8-19 Moderate 20-35 Severe  PMH: Past Medical History:  Diagnosis Date  . Graves disease 11/03/2017  . Hyperlipidemia 11/03/2017  . Hypertension 11/03/2017  . Hypothyroidism 11/03/2017  . Pneumothorax 1997    Surgical History: Past Surgical History:  Procedure Laterality Date  . COLONOSCOPY WITH PROPOFOL    . COLONOSCOPY WITH PROPOFOL N/A 12/17/2019   Procedure: COLONOSCOPY WITH PROPOFOL;  Surgeon: Pasty Spillers, MD;  Location: [facility] ENDOSCOPY;  Service: Endoscopy;  Laterality: N/A;    Home Medications:  Allergies as of 04/19/2020      Reactions   Poison Oak Extract Rash      Medication List       Accurate as of April 19, 2020 11:30  AM. If you have any questions, ask your nurse or doctor.        STOP taking these medications   polyethylene glycol 236 g solution Commonly known as: GoLYTELY Stopped by: Niketa Turner, PA-C     TAKE these medications   Fish Oil 1000 MG Caps Take by mouth.   levothyroxine 150 MCG tablet Commonly known as: SYNTHROID Take 1 tablet (150 mcg total) by mouth daily.   lisinopril 10 MG tablet Commonly known as: ZESTRIL Take 1 tablet (10 mg total) by mouth daily.   MULTIVITAMIN ADULT PO Take by mouth.   pravastatin 40 MG tablet Commonly known as: PRAVACHOL Take 1 tablet (40 mg total) by mouth daily.       Allergies:  Allergies  Allergen Reactions  . Poison Oak Extract Rash    Family History: Family History  Problem Relation Age of Onset  . Hypertension Mother   . Aneurysm Mother   . Heart attack Mother   . Lung cancer Father   .  Healthy Sister   . Hypertension Brother   . Healthy Son   . Healthy Daughter   . Hypertension Maternal Grandmother   . Aneurysm Maternal Grandfather   . Alzheimer's disease Paternal Grandmother     Social History:  reports that he has never smoked. He has never used smokeless tobacco. He reports that he does not drink alcohol and does not use drugs.  ROS: For pertinent review of systems please refer to history of present illness  Physical Exam: BP (!) 155/91   Pulse 90   Ht 6' (1.829 m)   Wt 230 lb (104.3 kg)   BMI 31.19 kg/m   Constitutional:  Well nourished. Alert and oriented, No acute distress. HEENT: [place] AT, mask in place.  Trachea midline Cardiovascular: No clubbing, cyanosis, or edema. Respiratory: Normal respiratory effort, no increased work of breathing. GU: No CVA tenderness.  No bladder fullness or masses.  Patient with uncircumcised phallus. Foreskin easily retracted  Urethral meatus is patent.  No penile discharge. No penile lesions or rashes. Scrotum without lesions, cysts, rashes and/or edema.  Testicles are located scrotally bilaterally. No masses are appreciated in the testicles. Left and right epididymis are normal. Rectal: Patient with  normal sphincter tone. Anus and perineum without scarring or rashes. No rectal masses are appreciated. Prostate is approximately 50 grams, could only palpate the apex, no nodules are appreciated. Seminal vesicles could not be palpated.   Skin: No rashes, bruises or suspicious lesions. Lymph: No inguinal adenopathy. Neurologic: Grossly intact, no focal deficits, moving all 4 extremities. Psychiatric: Normal mood and affect.  Laboratory Data: Urinalysis Component     Latest Ref Rng & Units 04/19/2020  Specific Gravity, UA     1.005 - 1.030 1.020  pH, UA     5.0 - 7.5 6.0  Color, UA     Yellow Yellow  Appearance Ur     Clear Clear  Leukocytes,UA     Negative Negative  Protein,UA     Negative/Trace 2+ (A)  Glucose, UA      Negative Negative  Ketones, UA     Negative Negative  RBC, UA     Negative 3+ (A)  Bilirubin, UA     Negative Negative  Urobilinogen, Ur     0.2 - 1.0 mg/dL 0.2  Nitrite, UA     Negative Negative  Microscopic Examination      See below:   Component     Latest Ref Rng &  Units 04/19/2020  WBC, UA     0 - 5 /hpf 0-5  RBC     0 - 2 /hpf >30 (A)  Epithelial Cells (non renal)     0 - 10 /hpf 0-10  Bacteria, UA     None seen/Few None seen  I have reviewed the labs.   Assessment & Plan:    1. High risk hematuria Negative work-up in October 2020 with noncontrast CT, renal MRI and cystoscopy No reports of gross hematuria UA > 30 RBC's  We will refer him onto nephrology to get their opinion regarding his persistent hematuria  2. Elevated PSA PSA appears to normalize Today's PSAs pending  3. BPH with LUTS IPSS score is 4/1 Continue conservative management, avoiding bladder irritants and timed voiding's RTC in 12 months for IPSS, PSA, PVR and exam - if PSA is stable   Michiel Cowboy, [facility] [address], [address] [place], [place] [address] [phone]

## 2020-04-19 ENCOUNTER — Ambulatory Visit: Payer: BC Managed Care – PPO | Admitting: Urology

## 2020-04-19 ENCOUNTER — Encounter: Payer: Self-pay | Admitting: Urology

## 2020-04-19 ENCOUNTER — Other Ambulatory Visit: Payer: Self-pay

## 2020-04-19 VITALS — BP 155/91 | HR 90 | Ht 72.0 in | Wt 230.0 lb

## 2020-04-19 DIAGNOSIS — R972 Elevated prostate specific antigen [PSA]: Secondary | ICD-10-CM | POA: Diagnosis not present

## 2020-04-19 DIAGNOSIS — R3129 Other microscopic hematuria: Secondary | ICD-10-CM

## 2020-04-19 DIAGNOSIS — N401 Enlarged prostate with lower urinary tract symptoms: Secondary | ICD-10-CM | POA: Diagnosis not present

## 2020-04-19 DIAGNOSIS — R319 Hematuria, unspecified: Secondary | ICD-10-CM | POA: Diagnosis not present

## 2020-04-19 DIAGNOSIS — N138 Other obstructive and reflux uropathy: Secondary | ICD-10-CM

## 2020-04-19 NOTE — Patient Instructions (Signed)
Follow up in one year with your labs done one week prior.

## 2020-04-20 ENCOUNTER — Telehealth: Payer: Self-pay | Admitting: Family Medicine

## 2020-04-20 LAB — MICROSCOPIC EXAMINATION
Bacteria, UA: NONE SEEN
RBC, Urine: >30 /hpf — AB (ref 0–2)

## 2020-04-20 LAB — URINALYSIS, COMPLETE
Bilirubin, UA: NEGATIVE
Glucose, UA: NEGATIVE
Ketones, UA: NEGATIVE
Leukocytes,UA: NEGATIVE
Nitrite, UA: NEGATIVE
Specific Gravity, UA: 1.02 (ref 1.005–1.030)
Urobilinogen, Ur: 0.2 mg/dL (ref 0.2–1.0)
pH, UA: 6 (ref 5.0–7.5)

## 2020-04-20 LAB — PSA: Prostate Specific Ag, Serum: 7 ng/mL — ABNORMAL HIGH (ref 0.0–4.0)

## 2020-04-20 NOTE — Telephone Encounter (Signed)
Unable to leave message,mailbox full. 

## 2020-04-20 NOTE — Telephone Encounter (Signed)
-----   Message from Harle Battiest, PA-C sent at 04/20/2020  8:25 AM EDT ----- Please let Scott Holloway know that his PSA has jumped up to 7.0 from 2.9 in May.  His PSA has been as high as 9.3 in the past, so I would like to have the PSA repeated in 1 month.

## 2020-04-21 NOTE — Telephone Encounter (Signed)
Unable to leave message,mailbox full. 

## 2020-04-26 NOTE — Telephone Encounter (Signed)
Unable to leave message mailbox is full.

## 2020-05-08 NOTE — Telephone Encounter (Signed)
Patient's wife is listed on his DPR, so I called her number and he was sitting right next to her when she answered.  I got him scheduled for next Friday for repeat PSA.

## 2020-05-09 ENCOUNTER — Other Ambulatory Visit: Payer: Self-pay

## 2020-05-17 ENCOUNTER — Other Ambulatory Visit: Payer: Self-pay

## 2020-05-17 DIAGNOSIS — R972 Elevated prostate specific antigen [PSA]: Secondary | ICD-10-CM

## 2020-05-19 ENCOUNTER — Other Ambulatory Visit: Payer: BC Managed Care – PPO

## 2020-05-19 ENCOUNTER — Other Ambulatory Visit: Payer: Self-pay

## 2020-05-19 DIAGNOSIS — R972 Elevated prostate specific antigen [PSA]: Secondary | ICD-10-CM | POA: Diagnosis not present

## 2020-05-20 LAB — PSA: Prostate Specific Ag, Serum: 3.8 ng/mL (ref 0.0–4.0)

## 2020-05-22 ENCOUNTER — Ambulatory Visit: Payer: BC Managed Care – PPO | Admitting: Family Medicine

## 2020-05-22 ENCOUNTER — Other Ambulatory Visit: Payer: Self-pay

## 2020-05-22 ENCOUNTER — Encounter: Payer: Self-pay | Admitting: Family Medicine

## 2020-05-22 ENCOUNTER — Telehealth: Payer: Self-pay | Admitting: Family Medicine

## 2020-05-22 VITALS — BP 116/77 | HR 74 | Temp 98.7°F | Wt 229.0 lb

## 2020-05-22 DIAGNOSIS — E782 Mixed hyperlipidemia: Secondary | ICD-10-CM | POA: Diagnosis not present

## 2020-05-22 DIAGNOSIS — I10 Essential (primary) hypertension: Secondary | ICD-10-CM

## 2020-05-22 DIAGNOSIS — R972 Elevated prostate specific antigen [PSA]: Secondary | ICD-10-CM

## 2020-05-22 NOTE — Telephone Encounter (Signed)
Patient notified and voiced understanding. Appointment made. 

## 2020-05-22 NOTE — Progress Notes (Signed)
Established patient visit   Patient: Scott Holloway   DOB: 1965-07-06   54 y.o. Male  MRN: [id number] Visit Date: 05/22/2020  Today's healthcare provider: Dortha Kern, PA   No chief complaint on file.  Subjective    HPI   Lipid/Cholesterol, Follow-up  Last lipid panel Other pertinent labs  Lab Results  Component Value Date   CHOL 226 (H) 11/18/2019   HDL 48 11/18/2019   LDLCALC 152 (H) 11/18/2019   TRIG 144 11/18/2019   CHOLHDL 5.8 (H) 12/10/2018   Lab Results  Component Value Date   ALT 11 11/18/2019   AST 21 11/18/2019   PLT 228 11/18/2019   TSH 0.655 11/18/2019     He was last seen for this 6 months ago.  Management since that visit includes none.  He reports good compliance with treatment. He is not having side effects.   Symptoms: No chest pain No chest pressure/discomfort  No dyspnea No lower extremity edema  No numbness or tingling of extremity No orthopnea  No palpitations No paroxysmal nocturnal dyspnea  No speech difficulty No syncope   Current diet: in general, a "healthy" diet   Current exercise: none  The 10-year ASCVD risk score Denman George DC Montez Hageman., et al., 2013) is: 10%  ---------------------------------------------------------------------------------------------------  Past Medical History:  Diagnosis Date  . Graves disease 11/03/2017  . Hyperlipidemia 11/03/2017  . Hypertension 11/03/2017  . Hypothyroidism 11/03/2017  . Pneumothorax 1997   Past Surgical History:  Procedure Laterality Date  . COLONOSCOPY WITH PROPOFOL    . COLONOSCOPY WITH PROPOFOL N/A 12/17/2019   Procedure: COLONOSCOPY WITH PROPOFOL;  Surgeon: Pasty Spillers, MD;  Location: [facility];  Service: Endoscopy;  Laterality: N/A;   Family History  Problem Relation Age of Onset  . Hypertension Mother   . Aneurysm Mother   . Heart attack Mother   . Lung cancer Father   . Healthy Sister   . Hypertension Brother   . Healthy Son   . Healthy Daughter   .  Hypertension Maternal Grandmother   . Aneurysm Maternal Grandfather   . Alzheimer's disease Paternal Grandmother    Social History   Tobacco Use  . Smoking status: Never Smoker  . Smokeless tobacco: Never Used  Vaping Use  . Vaping Use: Never used  Substance Use Topics  . Alcohol use: Never  . Drug use: Never   Allergies  Allergen Reactions  . Poison Oak Extract Rash   Medications: Outpatient Medications Prior to Visit  Medication Sig  . levothyroxine (SYNTHROID) 150 MCG tablet Take 1 tablet (150 mcg total) by mouth daily.  Marland Kitchen lisinopril (ZESTRIL) 10 MG tablet Take 1 tablet (10 mg total) by mouth daily.  . Multiple Vitamin (MULTIVITAMIN ADULT PO) Take by mouth.  . Omega-3 Fatty Acids (FISH OIL) 1000 MG CAPS Take by mouth.  . pravastatin (PRAVACHOL) 40 MG tablet Take 1 tablet (40 mg total) by mouth daily.   No facility-administered medications prior to visit.    Review of Systems     Objective    BP 116/77 (BP Location: Right Arm, Patient Position: Sitting, Cuff Size: Normal)   Pulse 74   Temp 98.7 F (37.1 C) (Oral)   Wt 229 lb (103.9 kg)   SpO2 100%   BMI 31.06 kg/m   Wt Readings from Last 3 Encounters:  05/22/20 229 lb (103.9 kg)  04/19/20 230 lb (104.3 kg)  12/17/19 230 lb (104.3 kg)   BP Readings from Last 3 Encounters:  05/22/20 116/77  04/19/20 (!) 155/91  12/17/19 121/77   Physical Exam Constitutional:      General: He is not in acute distress.    Appearance: He is well-developed.  HENT:     Head: Normocephalic and atraumatic.     Right Ear: Hearing normal.     Left Ear: Hearing normal.     Nose: Nose normal.  Eyes:     General: Lids are normal. No scleral icterus.       Right eye: No discharge.        Left eye: No discharge.     Conjunctiva/sclera: Conjunctivae normal.  Cardiovascular:     Rate and Rhythm: Normal rate and regular rhythm.     Heart sounds: Normal heart sounds.  Pulmonary:     Effort: Pulmonary effort is normal. No  respiratory distress.  Abdominal:     General: Bowel sounds are normal.     Palpations: Abdomen is soft.  Musculoskeletal:        General: Normal range of motion.     Cervical back: Neck supple.  Skin:    Findings: No lesion or rash.  Neurological:     Mental Status: He is alert and oriented to person, place, and time.  Psychiatric:        Speech: Speech normal.        Behavior: Behavior normal.        Thought Content: Thought content normal.       No results found for any visits on 05/22/20.  Assessment & Plan    1. Mixed hyperlipidemia No side effects from Pravastatin 40 mg qd, Omega-3 Fish Oil 1000 mg qd and continue low fat diet. Recheck CMP and Lipid Panel. - Comprehensive metabolic panel - Lipid Panel With LDL/HDL Ratio  2. Essential hypertension Well controlled and tolerating the Lisinopril 10 mg qd. Recheck labs and follow up pending reports. - Comprehensive metabolic panel - Lipid Panel With LDL/HDL Ratio   No follow-ups on file.      Haywood Pao, PA, have reviewed all documentation for this visit. The documentation on 05/22/20 for the exam, diagnosis, procedures, and orders are all accurate and complete.    Dortha Kern, PA  [facility] [phone] (phone) [phone] (fax)  [facility]

## 2020-05-22 NOTE — Telephone Encounter (Signed)
-----   Message from Harle Battiest, PA-C sent at 05/22/2020  8:15 AM EST ----- Please let Mr. Bee know that his PSA has trended downwards.  I would like it repeated again in 6 months to ensure the downward trend continues.

## 2020-05-23 LAB — COMPREHENSIVE METABOLIC PANEL
ALT: 10 IU/L (ref 0–44)
AST: 20 IU/L (ref 0–40)
Albumin/Globulin Ratio: 1.4 (ref 1.2–2.2)
Albumin: 4 g/dL (ref 3.8–4.9)
Alkaline Phosphatase: 54 IU/L (ref 44–121)
BUN/Creatinine Ratio: 14 (ref 9–20)
BUN: 22 mg/dL (ref 6–24)
Bilirubin Total: 0.2 mg/dL (ref 0.0–1.2)
CO2: 25 mmol/L (ref 20–29)
Calcium: 9.1 mg/dL (ref 8.7–10.2)
Chloride: 103 mmol/L (ref 96–106)
Creatinine, Ser: 1.58 mg/dL — ABNORMAL HIGH (ref 0.76–1.27)
GFR calc Af Amer: 56 mL/min/{1.73_m2} — ABNORMAL LOW (ref >59)
GFR calc non Af Amer: 48 mL/min/{1.73_m2} — ABNORMAL LOW (ref >59)
Globulin, Total: 2.9 g/dL (ref 1.5–4.5)
Glucose: 95 mg/dL (ref 65–99)
Potassium: 4.3 mmol/L (ref 3.5–5.2)
Sodium: 142 mmol/L (ref 134–144)
Total Protein: 6.9 g/dL (ref 6.0–8.5)

## 2020-05-23 LAB — LIPID PANEL WITH LDL/HDL RATIO
Cholesterol, Total: 238 mg/dL — ABNORMAL HIGH (ref 100–199)
HDL: 43 mg/dL (ref >39)
LDL Chol Calc (NIH): 155 mg/dL — ABNORMAL HIGH (ref 0–99)
LDL/HDL Ratio: 3.6 ratio (ref 0.0–3.6)
Triglycerides: 217 mg/dL — ABNORMAL HIGH (ref 0–149)
VLDL Cholesterol Cal: 40 mg/dL (ref 5–40)

## 2020-05-24 ENCOUNTER — Other Ambulatory Visit: Payer: Self-pay | Admitting: Nephrology

## 2020-05-24 DIAGNOSIS — R3129 Other microscopic hematuria: Secondary | ICD-10-CM | POA: Diagnosis not present

## 2020-05-24 DIAGNOSIS — N1831 Chronic kidney disease, stage 3a: Secondary | ICD-10-CM | POA: Diagnosis not present

## 2020-05-24 DIAGNOSIS — I10 Essential (primary) hypertension: Secondary | ICD-10-CM | POA: Diagnosis not present

## 2020-05-24 DIAGNOSIS — R809 Proteinuria, unspecified: Secondary | ICD-10-CM | POA: Diagnosis not present

## 2020-05-30 ENCOUNTER — Other Ambulatory Visit: Payer: Self-pay

## 2020-05-30 DIAGNOSIS — E782 Mixed hyperlipidemia: Secondary | ICD-10-CM

## 2020-05-30 MED ORDER — ATORVASTATIN CALCIUM 80 MG PO TABS: 80.0000 mg | ORAL_TABLET | Freq: Every day | ORAL | 3 refills | Status: DC

## 2020-06-06 ENCOUNTER — Other Ambulatory Visit: Payer: Self-pay

## 2020-06-06 ENCOUNTER — Ambulatory Visit
Admission: RE | Admit: 2020-06-06 | Discharge: 2020-06-06 | Disposition: A | Discharge status: Home / Self Care | Payer: BC Managed Care – PPO | Source: Ambulatory Visit | Attending: Nephrology | Admitting: Nephrology

## 2020-06-06 DIAGNOSIS — N281 Cyst of kidney, acquired: Secondary | ICD-10-CM | POA: Diagnosis not present

## 2020-06-06 DIAGNOSIS — N1831 Chronic kidney disease, stage 3a: Secondary | ICD-10-CM | POA: Diagnosis not present

## 2020-07-03 DIAGNOSIS — R3129 Other microscopic hematuria: Secondary | ICD-10-CM | POA: Diagnosis not present

## 2020-07-03 DIAGNOSIS — I10 Essential (primary) hypertension: Secondary | ICD-10-CM | POA: Diagnosis not present

## 2020-07-03 DIAGNOSIS — N182 Chronic kidney disease, stage 2 (mild): Secondary | ICD-10-CM | POA: Diagnosis not present

## 2020-07-03 DIAGNOSIS — R809 Proteinuria, unspecified: Secondary | ICD-10-CM | POA: Diagnosis not present

## 2020-07-08 DIAGNOSIS — Z20828 Contact with and (suspected) exposure to other viral communicable diseases: Secondary | ICD-10-CM | POA: Diagnosis not present

## 2020-07-08 DIAGNOSIS — U071 COVID-19: Secondary | ICD-10-CM | POA: Diagnosis not present

## 2020-08-09 DIAGNOSIS — N182 Chronic kidney disease, stage 2 (mild): Secondary | ICD-10-CM | POA: Diagnosis not present

## 2020-08-15 DIAGNOSIS — R3129 Other microscopic hematuria: Secondary | ICD-10-CM | POA: Diagnosis not present

## 2020-08-15 DIAGNOSIS — I10 Essential (primary) hypertension: Secondary | ICD-10-CM | POA: Diagnosis not present

## 2020-08-15 DIAGNOSIS — R809 Proteinuria, unspecified: Secondary | ICD-10-CM | POA: Diagnosis not present

## 2020-08-15 DIAGNOSIS — N1831 Chronic kidney disease, stage 3a: Secondary | ICD-10-CM | POA: Diagnosis not present

## 2020-09-20 DIAGNOSIS — R3915 Urgency of urination: Secondary | ICD-10-CM | POA: Diagnosis not present

## 2020-11-20 ENCOUNTER — Other Ambulatory Visit: Payer: BC Managed Care – PPO

## 2020-11-20 ENCOUNTER — Other Ambulatory Visit: Payer: Self-pay

## 2020-11-20 DIAGNOSIS — R972 Elevated prostate specific antigen [PSA]: Secondary | ICD-10-CM

## 2020-11-21 LAB — PSA: Prostate Specific Ag, Serum: 5 ng/mL — ABNORMAL HIGH (ref 0.0–4.0)

## 2020-11-27 NOTE — Progress Notes (Deleted)
11/28/2020 8:46 PM   Scott Holloway 1965-11-22 [id number]  Referring provider: Tamsen Roers, PA-C [address] [place],  [place] [address]  No chief complaint on file.  Urological history: 1.  High risk hematuria -non-smoker -non contrast CT 03/2019 Both adrenal glands appear normal. There are indistinct hypodense lesions of both kidneys. One index lesion in the left mid kidney measures 2.4 by 1.6 cm on image 69/4 with internal density 13 Hounsfield units. No urinary tract calculi identified. No hydronephrosis or hydroureter. Urinary bladder unremarkable.  Upper normal size of the prostate gland -cysto 03/2019 NED -MRI 05/2019 Multiple small benign-appearing bilateral renal cysts. No evidence of renal neoplasm or other significant abnormality -followed by nephrology as well -UA ***  2. Elevated PSA Component     Latest Ref Rng & Units 11/04/2017 01/14/2018 12/10/2018 11/18/2019  Prostate Specific Ag, Serum     0.0 - 4.0 ng/mL 9.3 (H) 2.5 3.7 2.9   Component     Latest Ref Rng & Units 04/19/2020 05/19/2020 11/20/2020  Prostate Specific Ag, Serum     0.0 - 4.0 ng/mL 7.0 (H) 3.8 5.0 (H)   3. BPH with LU TS -I PSS *** -PVR ***  4. Family history of prostate cancer -maternal/paternal cousin with prostate cancer    HPI: Scott Holloway is a 55 y.o. male who presents today for an elevated PSA.           Score:  1-7 Mild 8-19 Moderate 20-35 Severe  PMH: Past Medical History:  Diagnosis Date  . Graves disease 11/03/2017  . Hyperlipidemia 11/03/2017  . Hypertension 11/03/2017  . Hypothyroidism 11/03/2017  . Pneumothorax 1997    Surgical History: Past Surgical History:  Procedure Laterality Date  . COLONOSCOPY WITH PROPOFOL    . COLONOSCOPY WITH PROPOFOL N/A 12/17/2019   Procedure: COLONOSCOPY WITH PROPOFOL;  Surgeon: Pasty Spillers, MD;  Location: [facility] ENDOSCOPY;  Service: Endoscopy;  Laterality: N/A;    Home Medications:  Allergies as of 11/28/2020       Reactions   Poison Oak Extract Rash      Medication List       Accurate as of Nov 27, 2020  8:46 PM. If you have any questions, ask your nurse or doctor.        atorvastatin 80 MG tablet Commonly known as: LIPITOR Take 1 tablet (80 mg total) by mouth daily.   Fish Oil 1000 MG Caps Take by mouth.   levothyroxine 150 MCG tablet Commonly known as: SYNTHROID Take 1 tablet (150 mcg total) by mouth daily.   lisinopril 10 MG tablet Commonly known as: ZESTRIL Take 1 tablet (10 mg total) by mouth daily.   MULTIVITAMIN ADULT PO Take by mouth.       Allergies:  Allergies  Allergen Reactions  . Poison Oak Extract Rash    Family History: Family History  Problem Relation Age of Onset  . Hypertension Mother   . Aneurysm Mother   . Heart attack Mother   . Lung cancer Father   . Healthy Sister   . Hypertension Brother   . Healthy Son   . Healthy Daughter   . Hypertension Maternal Grandmother   . Aneurysm Maternal Grandfather   . Alzheimer's disease Paternal Grandmother     Social History:  reports that he has never smoked. He has never used smokeless tobacco. He reports that he does not drink alcohol and does not use drugs.  ROS: For pertinent review of systems please refer  to history of present illness  Physical Exam: There were no vitals taken for this visit.  Constitutional:  Well nourished. Alert and oriented, No acute distress. HEENT: [place] AT, moist mucus membranes.  Trachea midline Cardiovascular: No clubbing, cyanosis, or edema. Respiratory: Normal respiratory effort, no increased work of breathing. GI: Abdomen is soft, non tender, non distended, no abdominal masses. Liver and spleen not palpable.  No hernias appreciated.  Stool sample for occult testing is not indicated.   GU: No CVA tenderness.  No bladder fullness or masses.  Patient with circumcised/uncircumcised phallus. ***Foreskin easily retracted***  Urethral meatus is patent.  No penile discharge. No  penile lesions or rashes. Scrotum without lesions, cysts, rashes and/or edema.  Testicles are located scrotally bilaterally. No masses are appreciated in the testicles. Left and right epididymis are normal. Rectal: Patient with  normal sphincter tone. Anus and perineum without scarring or rashes. No rectal masses are appreciated. Prostate is approximately *** grams, *** nodules are appreciated. Seminal vesicles are normal. Skin: No rashes, bruises or suspicious lesions. Lymph: No inguinal adenopathy. Neurologic: Grossly intact, no focal deficits, moving all 4 extremities. Psychiatric: Normal mood and affect.  Laboratory Data: Urinalysis Color Yellow, Violet, Light Violet, Dark Violet Yellow   Clarity Clear Clear   Specific Gravity 1.000 - 1.030 1.020   pH, Urine 5.0 - 8.0 6.0   Protein, Urinalysis Negative, Trace mg/dL 563 Abnormal   Glucose, Urinalysis Negative mg/dL Negative   Ketones, Urinalysis Negative mg/dL Negative   Blood, Urinalysis Negative LargeAbnormal   Nitrite, Urinalysis Negative Negative   Leukocyte Esterase, Urinalysis Negative Negative   White Blood Cells, Urinalysis None Seen, 0-3 /hpf None Seen   Red Blood Cells, Urinalysis None Seen, 0-3 /hpf >50Abnormal   Bacteria, Urinalysis None Seen /hpf RareAbnormal   Squamous Epithelial Cells, Urinalysis Rare, Few, None Seen /hpf None Seen   Resulting Agency  [facility]  Specimen Collected: 09/20/20 11:14 Last Resulted: 09/20/20 11:32  Received From: [facility]  Result Received: 10/17/20 15:16  I have reviewed the labs.   Assessment & Plan:    1. High risk hematuria -***  2. Elevated PSA -We reviewed the implications of an elevated PSA and the uncertainty surrounding it. In general, a man's PSA increases with age and is produced by both normal and cancerous prostate tissue. The differential diagnosis for elevated PSA includes BPH, prostate cancer, infection, recent  intercourse/ejaculation, recent urethroscopic manipulation (foley placement/cystoscopy) or trauma, and prostatitis.  -Management of an elevated PSA can include observation or prostate biopsy and we discussed this in detail. Our goal is to detect clinically significant prostate cancers, and manage with either active surveillance, surgery, or radiation for localized disease. Risks of prostate biopsy include bleeding, infection (including life threatening sepsis), pain, and lower urinary symptoms. Hematuria, hematospermia, and blood in the stool are all common after biopsy and can persist up to 4 weeks.   3. BPH with LUTS ***  Michiel Cowboy, [facility] [address], [address] [place], [place] [address] [phone]

## 2020-11-28 ENCOUNTER — Ambulatory Visit: Payer: Self-pay | Admitting: Urology

## 2020-11-28 ENCOUNTER — Telehealth: Payer: Self-pay

## 2020-11-28 DIAGNOSIS — I10 Essential (primary) hypertension: Secondary | ICD-10-CM

## 2020-11-28 DIAGNOSIS — R3129 Other microscopic hematuria: Secondary | ICD-10-CM

## 2020-11-28 DIAGNOSIS — R972 Elevated prostate specific antigen [PSA]: Secondary | ICD-10-CM

## 2020-11-28 DIAGNOSIS — N138 Other obstructive and reflux uropathy: Secondary | ICD-10-CM

## 2020-11-28 DIAGNOSIS — E039 Hypothyroidism, unspecified: Secondary | ICD-10-CM

## 2020-11-28 DIAGNOSIS — E782 Mixed hyperlipidemia: Secondary | ICD-10-CM

## 2020-11-28 MED ORDER — ATORVASTATIN CALCIUM 80 MG PO TABS: 80.0000 mg | ORAL_TABLET | Freq: Every day | ORAL | 0 refills | Status: DC

## 2020-11-28 MED ORDER — LEVOTHYROXINE SODIUM 150 MCG PO TABS: 150.0000 ug | ORAL_TABLET | Freq: Every day | ORAL | 0 refills | Status: DC

## 2020-11-28 MED ORDER — LISINOPRIL 10 MG PO TABS: 10.0000 mg | ORAL_TABLET | Freq: Every day | ORAL | 0 refills | Status: DC

## 2020-11-28 NOTE — Telephone Encounter (Signed)
Patient needs Atorvastatin, lisinopril and Levothyroxine refills sent to [facility] on [address]

## 2020-11-28 NOTE — Telephone Encounter (Signed)
Rx's sent to pharmacy.  

## 2020-11-30 ENCOUNTER — Encounter: Payer: Self-pay | Admitting: Urology

## 2020-12-21 DIAGNOSIS — N1831 Chronic kidney disease, stage 3a: Secondary | ICD-10-CM | POA: Diagnosis not present

## 2020-12-21 DIAGNOSIS — I10 Essential (primary) hypertension: Secondary | ICD-10-CM | POA: Diagnosis not present

## 2020-12-21 DIAGNOSIS — R809 Proteinuria, unspecified: Secondary | ICD-10-CM | POA: Diagnosis not present

## 2020-12-21 DIAGNOSIS — R3129 Other microscopic hematuria: Secondary | ICD-10-CM | POA: Diagnosis not present

## 2021-01-03 ENCOUNTER — Other Ambulatory Visit: Payer: Self-pay

## 2021-01-03 ENCOUNTER — Other Ambulatory Visit
Admission: RE | Admit: 2021-01-03 | Discharge: 2021-01-03 | Disposition: A | Discharge status: Home / Self Care | Payer: BC Managed Care – PPO | Source: Ambulatory Visit | Attending: Nephrology | Admitting: Nephrology

## 2021-01-03 DIAGNOSIS — Z20822 Contact with and (suspected) exposure to covid-19: Secondary | ICD-10-CM | POA: Insufficient documentation

## 2021-01-03 DIAGNOSIS — Z01812 Encounter for preprocedural laboratory examination: Secondary | ICD-10-CM | POA: Insufficient documentation

## 2021-01-03 LAB — CBC WITH DIFFERENTIAL/PLATELET
Abs Immature Granulocytes: 0.03 10*3/uL (ref 0.00–0.07)
Basophils Absolute: 0 10*3/uL (ref 0.0–0.1)
Basophils Relative: 0 %
Eosinophils Absolute: 0.3 10*3/uL (ref 0.0–0.5)
Eosinophils Relative: 3 %
HCT: 36.8 % — ABNORMAL LOW (ref 39.0–52.0)
Hemoglobin: 12.1 g/dL — ABNORMAL LOW (ref 13.0–17.0)
Immature Granulocytes: 0 %
Lymphocytes Relative: 34 %
Lymphs Abs: 3.1 10*3/uL (ref 0.7–4.0)
MCH: 29.7 pg (ref 26.0–34.0)
MCHC: 32.9 g/dL (ref 30.0–36.0)
MCV: 90.2 fL (ref 80.0–100.0)
Monocytes Absolute: 0.5 10*3/uL (ref 0.1–1.0)
Monocytes Relative: 6 %
Neutro Abs: 5.1 10*3/uL (ref 1.7–7.7)
Neutrophils Relative %: 57 %
Platelets: 204 10*3/uL (ref 150–400)
RBC: 4.08 MIL/uL — ABNORMAL LOW (ref 4.22–5.81)
RDW: 12.8 % (ref 11.5–15.5)
WBC: 9.1 10*3/uL (ref 4.0–10.5)
nRBC: 0 % (ref 0.0–0.2)

## 2021-01-03 LAB — TYPE AND SCREEN
ABO/RH(D): A POS
Antibody Screen: NEGATIVE

## 2021-01-03 LAB — URINALYSIS, ROUTINE W REFLEX MICROSCOPIC
Bilirubin Urine: NEGATIVE
Glucose, UA: NEGATIVE mg/dL
Ketones, ur: NEGATIVE mg/dL
Leukocytes,Ua: NEGATIVE
Nitrite: NEGATIVE
Protein, ur: 100 mg/dL — AB
Specific Gravity, Urine: 1.015 (ref 1.005–1.030)
Squamous Epithelial / HPF: NONE SEEN (ref 0–5)
pH: 6 (ref 5.0–8.0)

## 2021-01-03 LAB — COMPREHENSIVE METABOLIC PANEL
ALT: 16 U/L (ref 0–44)
AST: 24 U/L (ref 15–41)
Albumin: 3.4 g/dL — ABNORMAL LOW (ref 3.5–5.0)
Alkaline Phosphatase: 50 U/L (ref 38–126)
Anion gap: 7 (ref 5–15)
BUN: 22 mg/dL — ABNORMAL HIGH (ref 6–20)
CO2: 27 mmol/L (ref 22–32)
Calcium: 8.7 mg/dL — ABNORMAL LOW (ref 8.9–10.3)
Chloride: 106 mmol/L (ref 98–111)
Creatinine, Ser: 1.46 mg/dL — ABNORMAL HIGH (ref 0.61–1.24)
GFR, Estimated: 56 mL/min — ABNORMAL LOW (ref >60)
Glucose, Bld: 91 mg/dL (ref 70–99)
Potassium: 3.5 mmol/L (ref 3.5–5.1)
Sodium: 140 mmol/L (ref 135–145)
Total Bilirubin: 0.6 mg/dL (ref 0.3–1.2)
Total Protein: 6.8 g/dL (ref 6.5–8.1)

## 2021-01-03 LAB — PROTEIN / CREATININE RATIO, URINE
Creatinine, Urine: 146 mg/dL
Protein Creatinine Ratio: 0.97 mg/mg{Cre} — ABNORMAL HIGH (ref 0.00–0.15)
Total Protein, Urine: 141 mg/dL

## 2021-01-03 LAB — PROTIME-INR
INR: 0.9 (ref 0.8–1.2)
Prothrombin Time: 12.6 seconds (ref 11.4–15.2)

## 2021-01-04 LAB — SARS CORONAVIRUS 2 (TAT 6-24 HRS): SARS Coronavirus 2: NEGATIVE

## 2021-01-05 ENCOUNTER — Encounter: Payer: Self-pay | Admitting: Nephrology

## 2021-01-05 ENCOUNTER — Observation Stay
Admission: RE | Admit: 2021-01-05 | Discharge: 2021-01-06 | Disposition: A | Discharge status: Home / Self Care | Payer: BC Managed Care – PPO | Source: Ambulatory Visit | Attending: Nephrology | Admitting: Nephrology

## 2021-01-05 ENCOUNTER — Observation Stay: Payer: BC Managed Care – PPO

## 2021-01-05 ENCOUNTER — Other Ambulatory Visit: Payer: Self-pay

## 2021-01-05 DIAGNOSIS — N281 Cyst of kidney, acquired: Secondary | ICD-10-CM

## 2021-01-05 DIAGNOSIS — I10 Essential (primary) hypertension: Secondary | ICD-10-CM | POA: Diagnosis not present

## 2021-01-05 DIAGNOSIS — R3129 Other microscopic hematuria: Secondary | ICD-10-CM | POA: Diagnosis not present

## 2021-01-05 DIAGNOSIS — Z79899 Other long term (current) drug therapy: Secondary | ICD-10-CM | POA: Diagnosis not present

## 2021-01-05 DIAGNOSIS — N1831 Chronic kidney disease, stage 3a: Secondary | ICD-10-CM | POA: Insufficient documentation

## 2021-01-05 DIAGNOSIS — I129 Hypertensive chronic kidney disease with stage 1 through stage 4 chronic kidney disease, or unspecified chronic kidney disease: Secondary | ICD-10-CM | POA: Diagnosis not present

## 2021-01-05 DIAGNOSIS — R809 Proteinuria, unspecified: Secondary | ICD-10-CM | POA: Diagnosis not present

## 2021-01-05 DIAGNOSIS — N049 Nephrotic syndrome with unspecified morphologic changes: Secondary | ICD-10-CM

## 2021-01-05 DIAGNOSIS — N269 Renal sclerosis, unspecified: Principal | ICD-10-CM | POA: Insufficient documentation

## 2021-01-05 DIAGNOSIS — E039 Hypothyroidism, unspecified: Secondary | ICD-10-CM | POA: Diagnosis not present

## 2021-01-05 LAB — ABO/RH: ABO/RH(D): A POS

## 2021-01-05 MED ORDER — OXYCODONE-ACETAMINOPHEN 5-325 MG PO TABS: 1.0000 | ORAL_TABLET | Freq: Four times a day (QID) | ORAL | Status: DC | PRN

## 2021-01-05 MED ORDER — FENTANYL CITRATE (PF) 100 MCG/2ML IJ SOLN
INTRAMUSCULAR | Status: AC
  Filled 2021-01-05: qty 2

## 2021-01-05 MED ORDER — MIDAZOLAM HCL 2 MG/2ML IJ SOLN
INTRAMUSCULAR | Status: AC | PRN
  Administered 2021-01-05 (×2): 1 mg via INTRAVENOUS

## 2021-01-05 MED ORDER — SODIUM CHLORIDE 0.9 % IV SOLN: INTRAVENOUS | Status: DC

## 2021-01-05 MED ORDER — FENTANYL CITRATE (PF) 100 MCG/2ML IJ SOLN
INTRAMUSCULAR | Status: AC | PRN
  Administered 2021-01-05 (×2): 50 ug via INTRAVENOUS

## 2021-01-05 MED ORDER — MIDAZOLAM HCL 2 MG/2ML IJ SOLN
INTRAMUSCULAR | Status: AC
  Filled 2021-01-05: qty 2

## 2021-01-05 NOTE — Plan of Care (Signed)
Reviewed with pt

## 2021-01-05 NOTE — Progress Notes (Signed)
[facility]  ROUNDING NOTE   Subjective:  Pt brought in for renal biopsy for CKD stage 3A, proteinuria, and hematuria.  Optimal window was not present for [place], therefore we consulted with interventional radiology to perform biopsy.    Objective:  Vital signs in last 24 hours:  Temp:  [98.1 F (36.7 C)] 98.1 F (36.7 C) (07/08 1258) Pulse Rate:  [38-72] 69 (07/08 1258) Resp:  [12-20] 16 (07/08 1258) BP: (124-147)/(54-90) 131/90 (07/08 1258) SpO2:  [98 %-100 %] 98 % (07/08 1258) Weight:  [103.5 kg] 103.5 kg (07/08 0805)  Weight change:  Filed Weights   01/05/21 0805  Weight: 103.5 kg    Intake/Output: No intake/output data recorded.   Intake/Output this shift:  No intake/output data recorded.  Physical Exam: General: No acute distress  Head: Normocephalic, atraumatic. Hearing intact  Eyes: Anicteric  Neck: Trachea midline  Lungs:  Non labored breathing  Heart: Regular   Abdomen:  Non distended  Extremities: No peripheral edema.  Neurologic: Awake, alert, following commands  Skin: No acute rash       Basic Metabolic Panel: Recent Labs  Lab 01/03/21 1348  NA 140  K 3.5  CL 106  CO2 27  GLUCOSE 91  BUN 22*  CREATININE 1.46*  CALCIUM 8.7*    Liver Function Tests: Recent Labs  Lab 01/03/21 1348  AST 24  ALT 16  ALKPHOS 50  BILITOT 0.6  PROT 6.8  ALBUMIN 3.4*   No results for input(s): LIPASE, AMYLASE in the last 168 hours. No results for input(s): AMMONIA in the last 168 hours.  CBC: Recent Labs  Lab 01/03/21 1348  WBC 9.1  NEUTROABS 5.1  HGB 12.1*  HCT 36.8*  MCV 90.2  PLT 204    Cardiac Enzymes: No results for input(s): CKTOTAL, CKMB, CKMBINDEX, TROPONINI in the last 168 hours.  BNP: Invalid input(s): POCBNP  CBG: No results for input(s): GLUCAP in the last 168 hours.  Microbiology: Results for orders placed or performed during the hospital encounter of 01/03/21  SARS CORONAVIRUS 2 (TAT 6-24 HRS) Nasopharyngeal  Nasopharyngeal Swab     Status: None   Collection Time: 01/03/21  1:48 PM   Specimen: Nasopharyngeal Swab  Result Value Ref Range Status   SARS Coronavirus 2 NEGATIVE NEGATIVE Final    Comment: (NOTE) SARS-CoV-2 target nucleic acids are NOT DETECTED.  The SARS-CoV-2 RNA is generally detectable in upper and lower respiratory specimens during the acute phase of infection. Negative results do not preclude SARS-CoV-2 infection, do not rule out co-infections with other pathogens, and should not be used as the sole basis for treatment or other patient management decisions. Negative results must be combined with clinical observations, patient history, and epidemiological information. The expected result is Negative.  Fact Sheet for Patients: HairSlick.no  Fact Sheet for Healthcare Providers: quierodirigir.com  This test is not yet approved or cleared by the [place] FDA and  has been authorized for detection and/or diagnosis of SARS-CoV-2 by FDA under an Emergency Use Authorization (EUA). This EUA will remain  in effect (meaning this test can be used) for the duration of the COVID-19 declaration under Se ction 564(b)(1) of the Act, 21 U.S.C. section 360bbb-3(b)(1), unless the authorization is terminated or revoked sooner.  Performed at [facility], [address], [place], [place] [address]     Coagulation Studies: Recent Labs    01/03/21 1348  LABPROT 12.6  INR 0.9    Urinalysis: Recent Labs  01/03/21 1348  COLORURINE YELLOW*  LABSPEC 1.015  PHURINE 6.0  GLUCOSEU NEGATIVE  HGBUR LARGE*  BILIRUBINUR NEGATIVE  KETONESUR NEGATIVE  PROTEINUR 100*  NITRITE NEGATIVE  LEUKOCYTESUR NEGATIVE      Imaging: US BIOPSY (KIDNEY)  Result Date: 01/05/2021 INDICATION: Proteinuria of uncertain etiology. Please perform ultrasound-guided renal biopsy for tissue diagnostic purposes. EXAM: ULTRASOUND GUIDED RENAL  BIOPSY COMPARISON:  Renal ultrasound-06/06/2020; CT abdomen and pelvis-03/19/2019 MEDICATIONS: None. ANESTHESIA/SEDATION: Fentanyl 100 mcg IV; Versed 2 mg IV Total Moderate Sedation time: 12 minutes; The patient was continuously monitored during the procedure by the interventional radiology nurse under my direct supervision. COMPLICATIONS: None immediate. PROCEDURE: Informed written consent was obtained from the patient after a discussion of the risks, benefits and alternatives to treatment. The patient understands and consents the procedure. A timeout was performed prior to the initiation of the procedure. Ultrasound scanning was performed of the bilateral flanks. Note is made of an approximately 2.8 x 1.6 x 1.5 cm partially exophytic anechoic cyst arising from the inferior pole of the left kidney as well as an approximately 1.2 x 1.1 x 1.1 cm right-sided parapelvic cyst. The inferior pole of the right kidney was selected for biopsy due to location and sonographic window. The procedure was planned. The operative site was prepped and draped in the usual sterile fashion. The overlying soft tissues were anesthetized with 1% lidocaine with epinephrine. A 17 gauge core needle biopsy device was advanced into the inferior cortex of the right kidney and 3 core biopsies were obtained under direct ultrasound guidance. Images were saved for documentation purposes. The biopsy device was removed and hemostasis was obtained with manual compression. Post procedural scanning was negative for significant post procedural hemorrhage or additional complication. A dressing was placed. The patient tolerated the procedure well without immediate post procedural complication. IMPRESSION: Technically successful ultrasound guided right renal biopsy. Electronically Signed   By: Simonne Come M.D.   On: 01/05/2021 15:16     Medications:    sodium chloride      fentaNYL       midazolam       oxyCODONE-acetaminophen  Assessment/ Plan:   55 y.o. male with past medical history of Graves' disease, hyperlipidemia, hypertension, hypothyroidism who was referred the evaluation of microscopic hematuria.   1. Chronic kidney disease stage IIIa  2. Proteinuria. Urine protein to creatinine ratio 0.93 3. Microscopic hematuria with dysmorphic RBCs on UA. 4. Hypertension.  Plan:  As discussed in office we are proceeding with renal biopsy at this time.  At the time of biopsy imaging reviewed with [place] tech.  Difficult window for biopsy pass noted.  Given this we consulted with interventional radiology and we deferred biopsy to them.  Post biopsy instructions given to patient.  We will monitor patient overnight.     LOS: 0 Tawnie Ehresman 7/8/20227:19 PM

## 2021-01-05 NOTE — Procedures (Signed)
Pre Procedure Dx: Proteinuria of uncertain etiology Post Procedural Dx: Same  Technically successful US guided biopsy of inferior pole of the right kidney.   EBL: Trace No immediate complications.   Katherina Right, MD Pager #: [phone]

## 2021-01-05 NOTE — Progress Notes (Signed)
Patient clinically stable post Renal biopsy per DR Grace Isaac, tolerated well. Denies complaints post procedure. Vitals stable pre and post procedure. No bleeding visible on [place] post procedure. Received Versed 2 mg along with Fentanyl 100 mcg IV for procedure. Report given to [facility] on 2 C post procedure/recovery with questions answered.

## 2021-01-06 DIAGNOSIS — R809 Proteinuria, unspecified: Secondary | ICD-10-CM | POA: Diagnosis not present

## 2021-01-06 DIAGNOSIS — I129 Hypertensive chronic kidney disease with stage 1 through stage 4 chronic kidney disease, or unspecified chronic kidney disease: Secondary | ICD-10-CM | POA: Diagnosis not present

## 2021-01-06 DIAGNOSIS — N1831 Chronic kidney disease, stage 3a: Secondary | ICD-10-CM | POA: Diagnosis not present

## 2021-01-06 DIAGNOSIS — N269 Renal sclerosis, unspecified: Secondary | ICD-10-CM | POA: Diagnosis not present

## 2021-01-06 DIAGNOSIS — N049 Nephrotic syndrome with unspecified morphologic changes: Secondary | ICD-10-CM

## 2021-01-06 DIAGNOSIS — R3129 Other microscopic hematuria: Secondary | ICD-10-CM | POA: Diagnosis not present

## 2021-01-06 DIAGNOSIS — Z79899 Other long term (current) drug therapy: Secondary | ICD-10-CM | POA: Diagnosis not present

## 2021-01-06 DIAGNOSIS — E039 Hypothyroidism, unspecified: Secondary | ICD-10-CM | POA: Diagnosis not present

## 2021-01-06 NOTE — Discharge Instructions (Signed)
Light duty. No heavy lifting over 20 pounds for the next 2 weeks.

## 2021-01-06 NOTE — Plan of Care (Signed)
Continuing with plan of care. 

## 2021-01-06 NOTE — Plan of Care (Signed)
Discharge teaching completed with patient who is in stable condition. 

## 2021-01-06 NOTE — Discharge Summary (Signed)
01/06/2021  Patient discharged in stable condition. Ultrasound guided right kidney biopsy by interventional radiology, Dr. Grace Isaac.   Clearance Chenault

## 2021-01-18 ENCOUNTER — Encounter: Payer: Self-pay | Admitting: Nephrology

## 2021-01-18 DIAGNOSIS — N041 Nephrotic syndrome with focal and segmental glomerular lesions: Secondary | ICD-10-CM | POA: Diagnosis not present

## 2021-01-18 DIAGNOSIS — N1831 Chronic kidney disease, stage 3a: Secondary | ICD-10-CM | POA: Diagnosis not present

## 2021-01-18 DIAGNOSIS — N038 Chronic nephritic syndrome with other morphologic changes: Secondary | ICD-10-CM | POA: Diagnosis not present

## 2021-01-18 DIAGNOSIS — R809 Proteinuria, unspecified: Secondary | ICD-10-CM | POA: Diagnosis not present

## 2021-01-18 LAB — SURGICAL PATHOLOGY

## 2021-03-07 ENCOUNTER — Other Ambulatory Visit: Payer: Self-pay | Admitting: Family Medicine

## 2021-03-07 DIAGNOSIS — E039 Hypothyroidism, unspecified: Secondary | ICD-10-CM

## 2021-03-07 NOTE — Telephone Encounter (Signed)
Requested medication (s) are due for refill today: last TSH 11/18/19  Requested medication (s) are on the active medication list: yes  Last refill:  11/29/19 #90 0 refills   Future visit scheduled: no  Notes to clinic:  last lab TSH 11/18/19 , do you want to refill ?      Requested Prescriptions  Pending Prescriptions Disp Refills   EUTHYROX 150 MCG tablet [Pharmacy Med Name: Euthyrox 150 MCG Oral Tablet] 90 tablet 0    Sig: Take 1 tablet by mouth once daily     Endocrinology:  Hypothyroid Agents Failed - 03/07/2021  5:30 AM      Failed - TSH needs to be rechecked within 3 months after an abnormal result. Refill until TSH is due.      Failed - TSH in normal range and within 360 days    TSH  Date Value Ref Range Status  11/18/2019 0.655 0.450 - 4.500 uIU/mL Final          Passed - Valid encounter within last 12 months    Recent Outpatient Visits           9 months ago Mixed hyperlipidemia   [employer], Jodell Cipro, PA-C   1 year ago Hypothyroidism (acquired)   [employer], Jodell Cipro, PA-C   2 years ago History of hematuria   [employer], Jodell Cipro, PA-C   2 years ago Other microscopic hematuria   [employer], Jodell Cipro, PA-C   3 years ago Essential hypertension   [facility], Jodell Cipro, PA-C       Future Appointments             In 1 month McGowan, Elana Alm [employer]

## 2021-03-15 DIAGNOSIS — N041 Nephrotic syndrome with focal and segmental glomerular lesions: Secondary | ICD-10-CM | POA: Diagnosis not present

## 2021-03-15 DIAGNOSIS — N038 Chronic nephritic syndrome with other morphologic changes: Secondary | ICD-10-CM | POA: Diagnosis not present

## 2021-03-15 DIAGNOSIS — N1831 Chronic kidney disease, stage 3a: Secondary | ICD-10-CM | POA: Diagnosis not present

## 2021-03-15 DIAGNOSIS — R809 Proteinuria, unspecified: Secondary | ICD-10-CM | POA: Diagnosis not present

## 2021-03-15 DIAGNOSIS — E1322 Other specified diabetes mellitus with diabetic chronic kidney disease: Secondary | ICD-10-CM | POA: Diagnosis not present

## 2021-04-09 ENCOUNTER — Other Ambulatory Visit: Payer: Self-pay | Admitting: Urology

## 2021-04-10 LAB — PSA: Prostate Specific Ag, Serum: 3.6 ng/mL (ref 0.0–4.0)

## 2021-04-16 ENCOUNTER — Telehealth: Payer: Self-pay

## 2021-04-16 NOTE — Telephone Encounter (Signed)
Attempted to contact patient regarding lab results (PSA), not able to leave message, "mailbox full".

## 2021-04-19 ENCOUNTER — Other Ambulatory Visit: Payer: Self-pay | Admitting: *Deleted

## 2021-04-19 ENCOUNTER — Other Ambulatory Visit: Payer: BC Managed Care – PPO

## 2021-04-19 DIAGNOSIS — R972 Elevated prostate specific antigen [PSA]: Secondary | ICD-10-CM

## 2021-04-19 DIAGNOSIS — R319 Hematuria, unspecified: Secondary | ICD-10-CM

## 2021-04-20 ENCOUNTER — Encounter: Payer: Self-pay | Admitting: Urology

## 2021-04-23 NOTE — Progress Notes (Deleted)
04/24/2021 10:55 AM   Scott Holloway Mar 16, 1966 [id number]  Referring provider: Tamsen Roers, PA-C No address on file  No chief complaint on file.  Urological history: 1.  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 Elevated PSA -PSA trend Component     Latest Ref Rng & Units 11/04/2017 01/14/2018 12/10/2018 11/18/2019  Prostate Specific Ag, Serum     0.0 - 4.0 ng/mL 9.3 (H) 2.5 3.7 2.9   Component     Latest Ref Rng & Units 04/19/2020 05/19/2020 11/20/2020 04/09/2021  Prostate Specific Ag, Serum     0.0 - 4.0 ng/mL 7.0 (H) 3.8 5.0 (H) 3.6   3. Family history of prostate cancer -maternal/paternal cousin with prostate cancer    HPI: Scott Holloway is a 55 y.o. male who presents today for follow up.            Score:  1-7 Mild 8-19 Moderate 20-35 Severe  PMH: Past Medical History:  Diagnosis Date   Graves disease 11/03/2017   Hyperlipidemia 11/03/2017   Hypertension 11/03/2017   Hypothyroidism 11/03/2017   Pneumothorax 1997    Surgical History: Past Surgical History:  Procedure Laterality Date   COLONOSCOPY WITH PROPOFOL     COLONOSCOPY WITH PROPOFOL N/A 12/17/2019   Procedure: COLONOSCOPY WITH PROPOFOL;  Surgeon: Pasty Spillers, MD;  Location: [facility];  Service: Endoscopy;  Laterality: N/A;    Home Medications:  Allergies as of 04/24/2021       Reactions   Poison Oak Extract Rash        Medication List        Accurate as of April 23, 2021 10:55 AM. If you have any questions, ask your nurse or doctor.          atorvastatin 80 MG tablet Commonly known as: LIPITOR Take 1 tablet (80 mg total) by mouth daily. "PATIENT NEEDS OFFICE VISIT BEFORE NEXT REFILL IS DUE"   Fish Oil 1000 MG Caps Take by mouth.    levothyroxine 150 MCG tablet Commonly known as: Euthyrox Take 1 tablet (150 mcg total) by mouth daily. Please schedule an office visit before anymore refills.   lisinopril 10 MG tablet Commonly known as: ZESTRIL Take 1 tablet (10 mg total) by mouth daily.   MULTIVITAMIN ADULT PO Take by mouth.        Allergies:  Allergies  Allergen Reactions   Poison Oak Extract Rash    Family History: Family History  Problem Relation Age of Onset   Hypertension Mother    Aneurysm Mother    Heart attack Mother    Lung cancer Father    Healthy Sister    Hypertension Brother    Healthy Son    Healthy Daughter    Hypertension Maternal Grandmother    Aneurysm Maternal Grandfather    Alzheimer's disease Paternal Grandmother     Social History:  reports that he has never smoked. He has never used smokeless tobacco. He reports that he does not drink alcohol and does not use drugs.  ROS: For pertinent review of systems please refer to history of present illness  Physical Exam: There were no vitals taken for this visit.  Constitutional:  Well nourished. Alert and oriented, No acute distress. HEENT: [place] AT, moist mucus membranes.  Trachea midline Cardiovascular: No  clubbing, cyanosis, or edema. Respiratory: Normal respiratory effort, no increased work of breathing. GI: Abdomen is soft, non tender, non distended, no abdominal masses. Liver and spleen not palpable.  No hernias appreciated.  Stool sample for occult testing is not indicated.   GU: No CVA tenderness.  No bladder fullness or masses.  Patient with circumcised/uncircumcised phallus. ***Foreskin easily retracted***  Urethral meatus is patent.  No penile discharge. No penile lesions or rashes. Scrotum without lesions, cysts, rashes and/or edema.  Testicles are located scrotally bilaterally. No masses are appreciated in the testicles. Left and right epididymis are normal. Rectal: Patient with  normal sphincter tone. Anus and perineum  without scarring or rashes. No rectal masses are appreciated. Prostate is approximately *** grams, *** nodules are appreciated. Seminal vesicles are normal. Skin: No rashes, bruises or suspicious lesions. Lymph: No inguinal adenopathy. Neurologic: Grossly intact, no focal deficits, moving all 4 extremities. Psychiatric: Normal mood and affect.   Laboratory Data: Component     Latest Ref Rng & Units 01/03/2021  Color, Urine     YELLOW YELLOW (A)  Appearance     CLEAR CLEAR (A)  Specific Gravity, Urine     1.005 - 1.030 1.015  pH     5.0 - 8.0 6.0  Glucose, UA     NEGATIVE mg/dL NEGATIVE  Hgb urine dipstick     NEGATIVE LARGE (A)  Bilirubin Urine     NEGATIVE NEGATIVE  Ketones, ur     NEGATIVE mg/dL NEGATIVE  Protein     NEGATIVE mg/dL 443 (A)  Nitrite     NEGATIVE NEGATIVE  Leukocytes,Ua     NEGATIVE NEGATIVE  RBC / HPF     0 - 5 RBC/hpf 21-50  WBC, UA     0 - 5 WBC/hpf 0-5  Bacteria, UA     NONE SEEN RARE (A)  Squamous Epithelial / LPF     0 - 5 NONE SEEN  Mucus      PRESENT  Specific Gravity, UA     1.005 - 1.030   pH, UA     5.0 - 7.5   Color, UA     Yellow   Appearance Ur     Clear   Leukocytes,UA     Negative   Protein,UA     Negative/Trace   Ketones, UA     Negative   RBC, UA     Negative   Bilirubin, UA     Negative   Urobilinogen, Ur     0.2 - 1.0 mg/dL   Nitrite, UA     Negative   Microscopic Examination        Clarity, UA        Glucose     Negative   Urobilinogen, UA     0.2 or 1.0 E.U./dL   RBC     0 - 2 /hpf   Epithelial Cells (non renal)     0 - 10 /hpf    Component     Latest Ref Rng & Units 01/03/2021  WBC     4.0 - 10.5 K/uL 9.1  RBC     4.22 - 5.81 MIL/uL 4.08 (L)  Hemoglobin     13.0 - 17.0 g/dL 15.4 (L)  HCT     00.8 - 52.0 % 36.8 (L)  MCV     80.0 - 100.0 fL 90.2  MCH     26.0 - 34.0 pg 29.7  MCHC     30.0 - 36.0  g/dL 50.9  RDW     32.6 - 71.2 % 12.8  Platelets     150 - 400 K/uL 204  nRBC     0.0 - 0.2 %  0.0  Neutrophils     % 57  NEUT#     1.7 - 7.7 K/uL 5.1  Lymphocytes     % 34  Lymphocyte #     0.7 - 4.0 K/uL 3.1  Monocytes Relative     % 6  Monocyte #     0.1 - 1.0 K/uL 0.5  Eosinophil     % 3  Eosinophils Absolute     0.0 - 0.5 K/uL 0.3  Basophil     % 0  Basophils Absolute     0.0 - 0.1 K/uL 0.0  Immature Granulocytes     % 0  Abs Immature Granulocytes     0.00 - 0.07 K/uL 0.03  Lymphs     Not Estab. %   Monocytes     Not Estab. %   Eos     Not Estab. %   Basos     Not Estab. %   Monocytes Absolute     0.1 - 0.9 x10E3/uL   EOS (ABSOLUTE)     0.0 - 0.4 x10E3/uL   Immature Grans (Abs)     0.0 - 0.1 x10E3/uL   WBC, UA     0 - 5 /hpf   Epithelial Cells (non renal)     0 - 10 /hpf   Bacteria, UA     None seen/Few    Component     Latest Ref Rng & Units 01/03/2021  Sodium     135 - 145 mmol/L 140  Potassium     3.5 - 5.1 mmol/L 3.5  Chloride     98 - 111 mmol/L 106  CO2     22 - 32 mmol/L 27  Glucose     70 - 99 mg/dL 91  BUN     6 - 20 mg/dL 22 (H)  Creatinine     0.61 - 1.24 mg/dL 4.58 (H)  Calcium     8.9 - 10.3 mg/dL 8.7 (L)  Total Protein     6.5 - 8.1 g/dL 6.8  Albumin     3.5 - 5.0 g/dL 3.4 (L)  AST     15 - 41 U/L 24  ALT     0 - 44 U/L 16  Alkaline Phosphatase     38 - 126 U/L 50  Total Bilirubin     0.3 - 1.2 mg/dL 0.6  GFR, Estimated     >60 mL/min 56 (L)  Anion gap     5 - 15 7  I have reviewed the labs.   Assessment & Plan:    1. High risk hematuria -Negative work-up in October 2020 with noncontrast CT, renal MRI and cystoscopy -No reports of gross hematuria -UA ***  2. Elevated PSA -PSA  back to baseline  3. BPH with LUTS -PSA stable -DRE benign -UA benign -PVR < 300 cc -symptoms - *** -most bothersome symptoms are *** -continue conservative management, avoiding bladder irritants and timed voiding's -Initiate alpha-blocker (***), discussed side effects *** -Initiate 5 alpha reductase inhibitor (***),  discussed side effects *** -Continue tamsulosin 0.4 mg daily, alfuzosin 10 mg daily, Rapaflo 8 mg daily, terazosin, doxazosin, Cialis 5 mg daily and finasteride 5 mg daily, dutasteride 0.5 mg daily***:refills given -Cannot tolerate medication or medication failure, schedule cystoscopy ***  Zara Council, PA-C  [facility] [address], [place], [place] [address] [phone]

## 2021-04-24 ENCOUNTER — Ambulatory Visit: Payer: BC Managed Care – PPO | Admitting: Urology

## 2021-04-24 ENCOUNTER — Encounter: Payer: Self-pay | Admitting: Urology

## 2021-04-24 DIAGNOSIS — R3129 Other microscopic hematuria: Secondary | ICD-10-CM

## 2021-04-24 DIAGNOSIS — R972 Elevated prostate specific antigen [PSA]: Secondary | ICD-10-CM

## 2021-04-24 DIAGNOSIS — N138 Other obstructive and reflux uropathy: Secondary | ICD-10-CM

## 2021-04-27 ENCOUNTER — Telehealth: Payer: Self-pay

## 2021-04-27 NOTE — Telephone Encounter (Signed)
Left message on voicemail requesting a return call regarding lab results.  

## 2021-04-30 ENCOUNTER — Telehealth: Payer: Self-pay

## 2021-04-30 ENCOUNTER — Other Ambulatory Visit: Payer: Self-pay | Admitting: Family Medicine

## 2021-04-30 DIAGNOSIS — E039 Hypothyroidism, unspecified: Secondary | ICD-10-CM

## 2021-04-30 DIAGNOSIS — E782 Mixed hyperlipidemia: Secondary | ICD-10-CM

## 2021-04-30 DIAGNOSIS — I10 Essential (primary) hypertension: Secondary | ICD-10-CM

## 2021-04-30 MED ORDER — LISINOPRIL 10 MG PO TABS: 10.0000 mg | ORAL_TABLET | Freq: Every day | ORAL | 0 refills | Status: DC

## 2021-04-30 MED ORDER — ATORVASTATIN CALCIUM 80 MG PO TABS: 80.0000 mg | ORAL_TABLET | Freq: Every day | ORAL | 0 refills | Status: DC

## 2021-04-30 NOTE — Telephone Encounter (Signed)
Requested medications are due for refill today.  yes  Requested medications are on the active medications list.  yes  Last refill. 03/07/2021  Future visit scheduled.   yes  Notes to clinic.  Failed protocol d/t expired labs.

## 2021-04-30 NOTE — Telephone Encounter (Signed)
Left message with patient's wife, requesting return call.

## 2021-04-30 NOTE — Telephone Encounter (Signed)
Medication Refill - Medication: Levothyroxine, Atorvastatin, Lisinopril   Has the patient contacted their pharmacy? Yes.   Pt states that he contacted the pharmacy and they state the office denied his request. Pt has an appt on 05/15/21 and is out of medications. Please advise.  (Agent: If no, request that the patient contact the pharmacy for the refill. If patient does not wish to contact the pharmacy document the reason why and proceed with request.) (Agent: If yes, when and what did the pharmacy advise?)  Preferred Pharmacy (with phone number or street name):  [facility] [address]), [place] - [address]  [address]) [place] [address]  Phone: [phone] Fax: [phone]  Hours: Not open 24 hours   Has the patient been seen for an appointment in the last year OR does the patient have an upcoming appointment? Yes.   05/15/21  Agent: Please be advised that RX refills may take up to 3 business days. WLe ask that you follow-up with your pharmacy.

## 2021-04-30 NOTE — Telephone Encounter (Signed)
Requested Prescriptions  Pending Prescriptions Disp Refills  . levothyroxine (EUTHYROX) 150 MCG tablet 90 tablet 0    Sig: Take 1 tablet (150 mcg total) by mouth daily. Please schedule an office visit before anymore refills.     Endocrinology:  Hypothyroid Agents Failed - 04/30/2021 10:12 AM      Failed - TSH needs to be rechecked within 3 months after an abnormal result. Refill until TSH is due.      Failed - TSH in normal range and within 360 days    TSH  Date Value Ref Range Status  11/18/2019 0.655 0.450 - 4.500 uIU/mL Final         Passed - Valid encounter within last 12 months    Recent Outpatient Visits          11 months ago Mixed hyperlipidemia   [facility], Jodell Cipro, PA-C   1 year ago Hypothyroidism (acquired)   [facility], Jodell Cipro, PA-C   2 years ago History of hematuria   [facility], Jodell Cipro, PA-C   2 years ago Other microscopic hematuria   [facility], Jodell Cipro, PA-C   3 years ago Essential hypertension   [facility], Jodell Cipro, PA-C      Future Appointments            In 2 weeks Maple Hudson., MD [facility]           . atorvastatin (LIPITOR) 80 MG tablet 90 tablet 0    Sig: Take 1 tablet (80 mg total) by mouth daily. "PATIENT NEEDS OFFICE VISIT BEFORE NEXT REFILL IS DUE"     Cardiovascular:  Antilipid - Statins Failed - 04/30/2021 10:12 AM      Failed - Total Cholesterol in normal range and within 360 days    Cholesterol, Total  Date Value Ref Range Status  05/22/2020 238 (H) 100 - 199 mg/dL Final         Failed - LDL in normal range and within 360 days    LDL Chol Calc (NIH)  Date Value Ref Range Status  05/22/2020 155 (H) 0 - 99 mg/dL Final         Failed - Triglycerides in normal range and within 360 days    Triglycerides  Date Value Ref Range Status  05/22/2020 217 (H) 0 - 149 mg/dL Final          Passed - HDL in normal range and within 360 days    HDL  Date Value Ref Range Status  05/22/2020 43 >39 mg/dL Final         Passed - Patient is not pregnant      Passed - Valid encounter within last 12 months    Recent Outpatient Visits          11 months ago Mixed hyperlipidemia   [facility], Jodell Cipro, PA-C   1 year ago Hypothyroidism (acquired)   [facility], Jodell Cipro, PA-C   2 years ago History of hematuria   [facility], Jodell Cipro, PA-C   2 years ago Other microscopic hematuria   [facility], Jodell Cipro, PA-C   3 years ago Essential hypertension   [facility], Jodell Cipro, PA-C      Future Appointments            In 2 weeks Maple Hudson., MD  [facility], [place]           . lisinopril (ZESTRIL) 10 MG tablet 90 tablet 0    Sig: Take 1 tablet (10 mg total) by mouth daily.     Cardiovascular:  ACE Inhibitors Failed - 04/30/2021 10:12 AM      Failed - Cr in normal range and within 180 days    Creatinine, Ser  Date Value Ref Range Status  01/03/2021 1.46 (H) 0.61 - 1.24 mg/dL Final   Creatinine, Urine  Date Value Ref Range Status  01/03/2021 146 mg/dL Final         Failed - Valid encounter within last 6 months    Recent Outpatient Visits          11 months ago Mixed hyperlipidemia   [facility], Vickki Muff, PA-C   1 year ago Hypothyroidism (acquired)   [facility], Vickki Muff, PA-C   2 years ago History of hematuria   [facility], Vickki Muff, PA-C   2 years ago Other microscopic hematuria   [facility], Vickki Muff, PA-C   3 years ago Essential hypertension   [facility], Vickki Muff, PA-C      Future Appointments            In 2 weeks Jerrol Banana., MD [facility]            Passed - K in normal range and within 180 days    Potassium  Date Value Ref Range Status  01/03/2021 3.5 3.5 - 5.1 mmol/L Final         Passed - Patient is not pregnant      Passed - Last BP in normal range    BP Readings from Last 1 Encounters:  01/06/21 124/73

## 2021-05-01 ENCOUNTER — Other Ambulatory Visit: Payer: Self-pay | Admitting: Family Medicine

## 2021-05-01 DIAGNOSIS — E039 Hypothyroidism, unspecified: Secondary | ICD-10-CM

## 2021-05-01 NOTE — Telephone Encounter (Signed)
Copied from CRM [id number]. Topic: Quick Communication - Rx Refill/Question >> May 01, 2021  1:12 PM Jaquita Rector A wrote: Medication: levothyroxine (EUTHYROX) 150 MCG tablet   Has the patient contacted their pharmacy? Yes.  Patient told that Rx expired  (Agent: If no, request that the patient contact the pharmacy for the refill. If patient does not wish to contact the pharmacy document the reason why and proceed with request.) (Agent: If yes, when and what did the pharmacy advise?)  Preferred Pharmacy (with phone number or street name): [facility] [address] [place]), [place] - [address]  Phone:  [phone] Fax:  [phone]    Has the patient been seen for an appointment in the last year OR does the patient have an upcoming appointment? Yes.    Agent: Please be advised that RX refills may take up to 3 business days. We ask that you follow-up with your pharmacy.

## 2021-05-01 NOTE — Telephone Encounter (Signed)
Call to pharmacy- Rx was filled and picked up 03/08/21 #90- patient should be good for next appointment. Call to patient- left message on VM- should have plenty of medication to get him to appointment 11/15. Requested Prescriptions  Refused Prescriptions Disp Refills  . levothyroxine (EUTHYROX) 150 MCG tablet 90 tablet 0    Sig: Take 1 tablet (150 mcg total) by mouth daily. Please schedule an office visit before anymore refills.     Endocrinology:  Hypothyroid Agents Failed - 05/01/2021  1:23 PM      Failed - TSH needs to be rechecked within 3 months after an abnormal result. Refill until TSH is due.      Failed - TSH in normal range and within 360 days    TSH  Date Value Ref Range Status  11/18/2019 0.655 0.450 - 4.500 uIU/mL Final         Passed - Valid encounter within last 12 months    Recent Outpatient Visits          11 months ago Mixed hyperlipidemia   [facility], Jodell Cipro, PA-C   1 year ago Hypothyroidism (acquired)   [facility], Jodell Cipro, PA-C   2 years ago History of hematuria   [facility], Jodell Cipro, PA-C   2 years ago Other microscopic hematuria   [facility], Jodell Cipro, PA-C   3 years ago Essential hypertension   [facility], Jodell Cipro, PA-C      Future Appointments            In 2 weeks Maple Hudson., MD [facility]

## 2021-05-15 ENCOUNTER — Encounter: Payer: BC Managed Care – PPO | Admitting: Family Medicine

## 2021-05-17 ENCOUNTER — Other Ambulatory Visit: Payer: Self-pay

## 2021-05-17 ENCOUNTER — Encounter: Payer: Self-pay | Admitting: Physician Assistant

## 2021-05-17 ENCOUNTER — Ambulatory Visit (INDEPENDENT_AMBULATORY_CARE_PROVIDER_SITE_OTHER): Payer: BC Managed Care – PPO | Admitting: Physician Assistant

## 2021-05-17 VITALS — BP 128/78 | HR 79 | Resp 16 | Ht 72.0 in | Wt 244.0 lb

## 2021-05-17 DIAGNOSIS — E890 Postprocedural hypothyroidism: Secondary | ICD-10-CM

## 2021-05-17 DIAGNOSIS — I10 Essential (primary) hypertension: Secondary | ICD-10-CM | POA: Diagnosis not present

## 2021-05-17 DIAGNOSIS — E782 Mixed hyperlipidemia: Secondary | ICD-10-CM | POA: Diagnosis not present

## 2021-05-17 DIAGNOSIS — Z0000 Encounter for general adult medical examination without abnormal findings: Secondary | ICD-10-CM | POA: Diagnosis not present

## 2021-05-17 DIAGNOSIS — Z6833 Body mass index (BMI) 33.0-33.9, adult: Secondary | ICD-10-CM | POA: Diagnosis not present

## 2021-05-17 MED ORDER — LEVOTHYROXINE SODIUM 150 MCG PO TABS
150.0000 ug | ORAL_TABLET | Freq: Every day | ORAL | 2 refills | Status: DC
Start: 2021-05-17 — End: 2022-03-05

## 2021-05-17 MED ORDER — ATORVASTATIN CALCIUM 80 MG PO TABS: 80.0000 mg | ORAL_TABLET | Freq: Every day | ORAL | 2 refills | Status: DC

## 2021-05-17 MED ORDER — LISINOPRIL 10 MG PO TABS: 10.0000 mg | ORAL_TABLET | Freq: Every day | ORAL | 2 refills | Status: DC

## 2021-05-17 NOTE — Progress Notes (Signed)
Complete physical exam   Patient: Scott Holloway   DOB: January 07, 1966   55 y.o. Male  MRN: [id number] Visit Date: 05/17/2021  Today's healthcare provider: Mikey Kirschner, PA-C   Chief Complaint  Patient presents with  . Annual Exam   Subjective    Scott Holloway is a 55 y.o. male who presents today for a complete physical exam.  He reports consuming a general diet. The patient has a physically strenuous job, but has no regular exercise apart from work.  He generally feels well. He reports sleeping well. He does not have additional problems to discuss today.   Past Medical History:  Diagnosis Date  . Graves disease 11/03/2017  . Hyperlipidemia 11/03/2017  . Hypertension 11/03/2017  . Hypothyroidism 11/03/2017  . Pneumothorax 1997  . Pneumothorax 07/02/1995   Past Surgical History:  Procedure Laterality Date  . COLONOSCOPY WITH PROPOFOL    . COLONOSCOPY WITH PROPOFOL N/A 12/17/2019   Procedure: COLONOSCOPY WITH PROPOFOL;  Surgeon: Virgel Manifold, MD;  Location: [facility] ENDOSCOPY;  Service: Endoscopy;  Laterality: N/A;   Social History   Socioeconomic History  . Marital status: Married    Spouse name: Not on file  . Number of children: Not on file  . Years of education: Not on file  . Highest education level: Not on file  Occupational History  . Not on file  Tobacco Use  . Smoking status: Never  . Smokeless tobacco: Never  Vaping Use  . Vaping Use: Never used  Substance and Sexual Activity  . Alcohol use: Never  . Drug use: Never  . Sexual activity: Yes    Birth control/protection: None  Other Topics Concern  . Not on file  Social History Narrative  . Not on file   Social Determinants of Health   Financial Resource Strain: Not on file  Food Insecurity: Not on file  Transportation Needs: Not on file  Physical Activity: Not on file  Stress: Not on file  Social Connections: Not on file  Intimate Partner Violence: Not on file   Family Status  Relation Name  Status  . Mother  Alive  . Father  Deceased  . Sister  Alive  . Brother  Alive       step brother  . Son  Alive  . Daughter  Alive  . MGM  Deceased  . MGF  Deceased  . PGM  Deceased  . PGF  Deceased       accidental death-froze to death    Family History  Problem Relation Age of Onset  . Hypertension Mother   . Aneurysm Mother   . Heart attack Mother   . Lung cancer Father   . Healthy Sister   . Hypertension Brother   . Healthy Son   . Healthy Daughter   . Hypertension Maternal Grandmother   . Aneurysm Maternal Grandfather   . Alzheimer's disease Paternal Grandmother    Allergies  Allergen Reactions  . Poison Oak Extract Rash    Patient Care Team: Mikey Kirschner, PA-C as PCP - General (Physician Assistant)   Medications: Outpatient Medications Prior to Visit  Medication Sig  . Multiple Vitamin (MULTIVITAMIN ADULT PO) Take by mouth.  . Omega-3 Fatty Acids (FISH OIL) 1000 MG CAPS Take by mouth.  . [DISCONTINUED] atorvastatin (LIPITOR) 80 MG tablet Take 1 tablet (80 mg total) by mouth daily.  . [DISCONTINUED] levothyroxine (EUTHYROX) 150 MCG tablet Take 1 tablet (150 mcg total) by mouth daily. Please  schedule an office visit before anymore refills.  . [DISCONTINUED] lisinopril (ZESTRIL) 10 MG tablet Take 1 tablet (10 mg total) by mouth daily.   No facility-administered medications prior to visit.    Review of Systems  Constitutional: Negative.   HENT:  Positive for dental problem.   Eyes: Negative.   Respiratory: Negative.    Cardiovascular: Negative.   Gastrointestinal: Negative.   Endocrine: Negative.   Genitourinary: Negative.   Musculoskeletal: Negative.   Skin: Negative.   Allergic/Immunologic: Negative.   Neurological: Negative.   Hematological: Negative.   Psychiatric/Behavioral: Negative.      Objective    BP 128/78 (BP Location: Left Arm, Patient Position: Sitting, Cuff Size: Large)   Pulse 79   Resp 16   Ht 6' (1.829 m)   Wt 244 lb (110.7  kg)   BMI 33.09 kg/m    Physical Exam Constitutional:      General: He is awake.     Appearance: He is well-developed.  HENT:     Head: Normocephalic.     Right Ear: Tympanic membrane, ear canal and external ear normal.     Left Ear: Tympanic membrane, ear canal and external ear normal.     Nose: Nose normal. No congestion or rhinorrhea.     Mouth/Throat:     Mouth: Mucous membranes are moist.     Pharynx: No oropharyngeal exudate or posterior oropharyngeal erythema.  Eyes:     Pupils: Pupils are equal, round, and reactive to light.  Cardiovascular:     Rate and Rhythm: Normal rate and regular rhythm.     Heart sounds: Normal heart sounds.  Pulmonary:     Effort: Pulmonary effort is normal.     Breath sounds: Normal breath sounds.  Abdominal:     General: There is no distension.     Palpations: Abdomen is soft.     Tenderness: There is no abdominal tenderness. There is no guarding.  Musculoskeletal:     Cervical back: Normal range of motion.     Right lower leg: No edema.     Comments: Pt has boot on L lower extremity, states he get achilles inflammation and wearing the boot for two weeks improves his symptoms.  Lymphadenopathy:     Cervical: No cervical adenopathy.  Skin:    General: Skin is warm.  Neurological:     Mental Status: He is alert and oriented to person, place, and time.  Psychiatric:        Attention and Perception: Attention normal.        Mood and Affect: Mood normal.        Speech: Speech normal.        Behavior: Behavior normal. Behavior is cooperative.    Last depression screening scores PHQ 2/9 Scores 05/17/2021 11/18/2019 11/04/2017  PHQ - 2 Score 0 0 0  PHQ- 9 Score - - 1   Last fall risk screening Fall Risk  05/17/2021  Falls in the past year? 0  Number falls in past yr: 0  Injury with Fall? 0   Last Audit-C alcohol use screening Alcohol Use Disorder Test (AUDIT) 05/17/2021  1. How often do you have a drink containing alcohol? 0  2. How  many drinks containing alcohol do you have on a typical day when you are drinking? 0  3. How often do you have six or more drinks on one occasion? 0  AUDIT-C Score 0   A score of 3 or more in women, and 4  or more in men indicates increased risk for alcohol abuse, EXCEPT if all of the points are from question 1   Results for orders placed or performed in visit on 05/17/21  Comprehensive metabolic panel  Result Value Ref Range   Glucose 92 70 - 99 mg/dL   BUN 24 6 - 24 mg/dL   Creatinine, Ser 1.62 (H) 0.76 - 1.27 mg/dL   eGFR 49 (L) >59 mL/min/1.73   BUN/Creatinine Ratio 15 9 - 20   Sodium 141 134 - 144 mmol/L   Potassium 4.1 3.5 - 5.2 mmol/L   Chloride 100 96 - 106 mmol/L   CO2 26 20 - 29 mmol/L   Calcium 9.5 8.7 - 10.2 mg/dL   Total Protein 7.1 6.0 - 8.5 g/dL   Albumin 4.0 3.8 - 4.9 g/dL   Globulin, Total 3.1 1.5 - 4.5 g/dL   Albumin/Globulin Ratio 1.3 1.2 - 2.2   Bilirubin Total 0.3 0.0 - 1.2 mg/dL   Alkaline Phosphatase 63 44 - 121 IU/L   AST 21 0 - 40 IU/L   ALT 14 0 - 44 IU/L  Lipid panel  Result Value Ref Range   Cholesterol, Total 229 (H) 100 - 199 mg/dL   Triglycerides 166 (H) 0 - 149 mg/dL   HDL 49 >39 mg/dL   VLDL Cholesterol Cal 30 5 - 40 mg/dL   LDL Chol Calc (NIH) 150 (H) 0 - 99 mg/dL   Chol/HDL Ratio 4.7 0.0 - 5.0 ratio  HgB A1c  Result Value Ref Range   Hgb A1c MFr Bld 6.2 (H) 4.8 - 5.6 %   Est. average glucose Bld gHb Est-mCnc 131 mg/dL  TSH + free T4  Result Value Ref Range   TSH 5.170 (H) 0.450 - 4.500 uIU/mL   Free T4 1.02 0.82 - 1.77 ng/dL    Assessment & Plan    Routine Health Maintenance and Physical Exam Immunization History  Administered Date(s) Administered  . PFIZER(Purple Top)SARS-COV-2 Vaccination 09/20/2019, 10/13/2019  . Tdap 11/18/2019    Health Maintenance  Topic Date Due  . COVID-19 Vaccine (3 - Booster for Pfizer series) 12/08/2019  . INFLUENZA VACCINE  07/02/2021 (Originally 01/29/2021)  . Zoster Vaccines- Shingrix (1 of 2)  08/17/2021 (Originally 08/28/1982)  . Pneumococcal Vaccine 74-33 Years old (1 - PCV) 05/17/2022 (Originally 08/28/1969)  . TETANUS/TDAP  11/17/2029  . COLONOSCOPY (Pts 45-71yr Insurance coverage will need to be confirmed)  12/16/2029  . Hepatitis C Screening  Completed  . HIV Screening  Completed  . HPV VACCINES  Aged Out    Discussed health benefits of physical activity, and encouraged him to engage in regular exercise appropriate for his age and condition.  Problem List Items Addressed This Visit       Cardiovascular and Mediastinum   Essential hypertension    Chronic and well controlled on medication. Continue lisinopril at current dose. F/u 4 mo      Relevant Medications   atorvastatin (LIPITOR) 80 MG tablet   lisinopril (ZESTRIL) 10 MG tablet     Endocrine   Postablative hypothyroidism    Chronic, stable on medication. Continue at current dose, can f/u 1 year.      Relevant Medications   levothyroxine (EUTHYROX) 150 MCG tablet   Other Relevant Orders   TSH + free T4 (Completed)     Other   Hyperlipidemia    Elevated LDL and trigs historically despite 80 mg atorvastatin.  Will recheck and consider adding Zetia if still elevated.  Relevant Medications   atorvastatin (LIPITOR) 80 MG tablet   lisinopril (ZESTRIL) 10 MG tablet   Other Relevant Orders   Comprehensive metabolic panel (Completed)   Lipid panel (Completed)   BMI 33.0-33.9,adult    Discussed weight loss through balanced diet and exercise. Pt has difficult time with portioning and eating a balanced diet. Aware his weight has increased and is determined to focus on weight loss. Will f/u 4 mo Discussed weight loss management assistance with medication, pt is not interested at this time.      Relevant Orders   Comprehensive metabolic panel (Completed)   HgB A1c (Completed)   Other Visit Diagnoses     Encounter for physical examination    -  Primary        Return in about 4 months (around  09/14/2021) for hypertension, hyperlipidemia, weight Management.     I, Mikey Kirschner, PA-C have reviewed all documentation for this visit. The documentation on 05/17/2021 for the exam, diagnosis, procedures, and orders are all accurate and complete.    Mikey Kirschner, PA-C  [facility] [phone] (phone) [phone] (fax)  [place]

## 2021-05-18 ENCOUNTER — Other Ambulatory Visit: Payer: Self-pay | Admitting: Physician Assistant

## 2021-05-18 ENCOUNTER — Encounter: Payer: Self-pay | Admitting: Physician Assistant

## 2021-05-18 DIAGNOSIS — Z6833 Body mass index (BMI) 33.0-33.9, adult: Secondary | ICD-10-CM | POA: Insufficient documentation

## 2021-05-18 DIAGNOSIS — E782 Mixed hyperlipidemia: Secondary | ICD-10-CM

## 2021-05-18 LAB — COMPREHENSIVE METABOLIC PANEL
ALT: 14 IU/L (ref 0–44)
AST: 21 IU/L (ref 0–40)
Albumin/Globulin Ratio: 1.3 (ref 1.2–2.2)
Albumin: 4 g/dL (ref 3.8–4.9)
Alkaline Phosphatase: 63 IU/L (ref 44–121)
BUN/Creatinine Ratio: 15 (ref 9–20)
BUN: 24 mg/dL (ref 6–24)
Bilirubin Total: 0.3 mg/dL (ref 0.0–1.2)
CO2: 26 mmol/L (ref 20–29)
Calcium: 9.5 mg/dL (ref 8.7–10.2)
Chloride: 100 mmol/L (ref 96–106)
Creatinine, Ser: 1.62 mg/dL — ABNORMAL HIGH (ref 0.76–1.27)
Globulin, Total: 3.1 g/dL (ref 1.5–4.5)
Glucose: 92 mg/dL (ref 70–99)
Potassium: 4.1 mmol/L (ref 3.5–5.2)
Sodium: 141 mmol/L (ref 134–144)
Total Protein: 7.1 g/dL (ref 6.0–8.5)
eGFR: 49 mL/min/{1.73_m2} — ABNORMAL LOW (ref >59)

## 2021-05-18 LAB — LIPID PANEL
Chol/HDL Ratio: 4.7 ratio (ref 0.0–5.0)
Cholesterol, Total: 229 mg/dL — ABNORMAL HIGH (ref 100–199)
HDL: 49 mg/dL (ref >39)
LDL Chol Calc (NIH): 150 mg/dL — ABNORMAL HIGH (ref 0–99)
Triglycerides: 166 mg/dL — ABNORMAL HIGH (ref 0–149)
VLDL Cholesterol Cal: 30 mg/dL (ref 5–40)

## 2021-05-18 LAB — TSH+FREE T4
Free T4: 1.02 ng/dL (ref 0.82–1.77)
TSH: 5.17 u[IU]/mL — ABNORMAL HIGH (ref 0.450–4.500)

## 2021-05-18 LAB — HEMOGLOBIN A1C
Est. average glucose Bld gHb Est-mCnc: 131 mg/dL
Hgb A1c MFr Bld: 6.2 % — ABNORMAL HIGH (ref 4.8–5.6)

## 2021-05-18 MED ORDER — EZETIMIBE 10 MG PO TABS: 10.0000 mg | ORAL_TABLET | Freq: Every day | ORAL | 3 refills | Status: DC

## 2021-05-18 NOTE — Assessment & Plan Note (Signed)
Elevated LDL and trigs historically despite 80 mg atorvastatin.  Will recheck and consider adding Zetia if still elevated.

## 2021-05-18 NOTE — Assessment & Plan Note (Addendum)
Discussed weight loss through balanced diet and exercise. Pt has difficult time with portioning and eating a balanced diet. Aware his weight has increased and is determined to focus on weight loss. Will f/u 4 mo Discussed weight loss management assistance with medication, pt is not interested at this time.

## 2021-05-18 NOTE — Assessment & Plan Note (Signed)
Chronic, stable on medication. Continue at current dose, can f/u 1 year.

## 2021-05-18 NOTE — Assessment & Plan Note (Addendum)
Chronic and well controlled on medication. Continue lisinopril at current dose. F/u 4 mo

## 2021-05-28 DIAGNOSIS — K640 First degree hemorrhoids: Secondary | ICD-10-CM | POA: Diagnosis not present

## 2021-06-13 ENCOUNTER — Telehealth: Payer: Self-pay | Admitting: Urology

## 2021-06-13 NOTE — Telephone Encounter (Signed)
Please have Scott Holloway reschedule his missed appointment from October for hematuria.

## 2021-06-13 NOTE — Telephone Encounter (Signed)
Called and left vm for patient to call in to be scheduled.

## 2021-06-19 NOTE — Telephone Encounter (Signed)
Left pt a vm to call office to be scheduled.

## 2021-07-05 ENCOUNTER — Encounter: Payer: Self-pay | Admitting: Urology

## 2021-07-17 NOTE — Telephone Encounter (Signed)
Left pt vm to call in to be rescheduled.

## 2021-07-24 ENCOUNTER — Other Ambulatory Visit: Payer: Self-pay

## 2021-07-24 ENCOUNTER — Emergency Department: Payer: BC Managed Care – PPO

## 2021-07-24 ENCOUNTER — Emergency Department
Admission: EM | Admit: 2021-07-24 | Discharge: 2021-07-24 | Disposition: A | Discharge status: Home / Self Care | Payer: BC Managed Care – PPO | Attending: Emergency Medicine | Admitting: Emergency Medicine

## 2021-07-24 ENCOUNTER — Encounter: Payer: Self-pay | Admitting: Emergency Medicine

## 2021-07-24 DIAGNOSIS — N400 Enlarged prostate without lower urinary tract symptoms: Secondary | ICD-10-CM | POA: Diagnosis not present

## 2021-07-24 DIAGNOSIS — Z20822 Contact with and (suspected) exposure to covid-19: Secondary | ICD-10-CM | POA: Insufficient documentation

## 2021-07-24 DIAGNOSIS — N451 Epididymitis: Secondary | ICD-10-CM | POA: Diagnosis not present

## 2021-07-24 DIAGNOSIS — R609 Edema, unspecified: Secondary | ICD-10-CM

## 2021-07-24 DIAGNOSIS — N179 Acute kidney failure, unspecified: Secondary | ICD-10-CM

## 2021-07-24 DIAGNOSIS — I10 Essential (primary) hypertension: Secondary | ICD-10-CM | POA: Diagnosis not present

## 2021-07-24 DIAGNOSIS — K429 Umbilical hernia without obstruction or gangrene: Secondary | ICD-10-CM | POA: Diagnosis not present

## 2021-07-24 DIAGNOSIS — N3289 Other specified disorders of bladder: Secondary | ICD-10-CM | POA: Diagnosis not present

## 2021-07-24 DIAGNOSIS — N281 Cyst of kidney, acquired: Secondary | ICD-10-CM | POA: Diagnosis not present

## 2021-07-24 DIAGNOSIS — N50812 Left testicular pain: Secondary | ICD-10-CM | POA: Diagnosis not present

## 2021-07-24 DIAGNOSIS — R109 Unspecified abdominal pain: Secondary | ICD-10-CM | POA: Diagnosis not present

## 2021-07-24 DIAGNOSIS — R300 Dysuria: Secondary | ICD-10-CM | POA: Insufficient documentation

## 2021-07-24 DIAGNOSIS — N433 Hydrocele, unspecified: Secondary | ICD-10-CM | POA: Diagnosis not present

## 2021-07-24 DIAGNOSIS — N5089 Other specified disorders of the male genital organs: Secondary | ICD-10-CM | POA: Diagnosis not present

## 2021-07-24 DIAGNOSIS — M7989 Other specified soft tissue disorders: Secondary | ICD-10-CM | POA: Diagnosis not present

## 2021-07-24 LAB — URINALYSIS, ROUTINE W REFLEX MICROSCOPIC
Bilirubin Urine: NEGATIVE
Glucose, UA: NEGATIVE mg/dL
Ketones, ur: NEGATIVE mg/dL
Nitrite: NEGATIVE
Protein, ur: >300 mg/dL — AB
RBC / HPF: >50 RBC/hpf — ABNORMAL HIGH (ref 0–5)
Specific Gravity, Urine: 1.02 (ref 1.005–1.030)
Squamous Epithelial / HPF: NONE SEEN (ref 0–5)
WBC, UA: >50 WBC/hpf — ABNORMAL HIGH (ref 0–5)
pH: 6 (ref 5.0–8.0)

## 2021-07-24 LAB — BASIC METABOLIC PANEL
Anion gap: 8 (ref 5–15)
BUN: 28 mg/dL — ABNORMAL HIGH (ref 6–20)
CO2: 24 mmol/L (ref 22–32)
Calcium: 9.2 mg/dL (ref 8.9–10.3)
Chloride: 104 mmol/L (ref 98–111)
Creatinine, Ser: 2.12 mg/dL — ABNORMAL HIGH (ref 0.61–1.24)
GFR, Estimated: 36 mL/min — ABNORMAL LOW (ref >60)
Glucose, Bld: 121 mg/dL — ABNORMAL HIGH (ref 70–99)
Potassium: 4.1 mmol/L (ref 3.5–5.1)
Sodium: 136 mmol/L (ref 135–145)

## 2021-07-24 LAB — CBC
HCT: 38.2 % — ABNORMAL LOW (ref 39.0–52.0)
Hemoglobin: 12.3 g/dL — ABNORMAL LOW (ref 13.0–17.0)
MCH: 29.6 pg (ref 26.0–34.0)
MCHC: 32.2 g/dL (ref 30.0–36.0)
MCV: 92 fL (ref 80.0–100.0)
Platelets: 332 10*3/uL (ref 150–400)
RBC: 4.15 MIL/uL — ABNORMAL LOW (ref 4.22–5.81)
RDW: 13.2 % (ref 11.5–15.5)
WBC: 18.9 10*3/uL — ABNORMAL HIGH (ref 4.0–10.5)
nRBC: 0 % (ref 0.0–0.2)

## 2021-07-24 LAB — RESP PANEL BY RT-PCR (FLU A&B, COVID) ARPGX2
Influenza A by PCR: NEGATIVE
Influenza B by PCR: NEGATIVE
SARS Coronavirus 2 by RT PCR: NEGATIVE

## 2021-07-24 LAB — CHLAMYDIA/NGC RT PCR (ARMC ONLY)
Chlamydia Tr: NOT DETECTED
N gonorrhoeae: NOT DETECTED

## 2021-07-24 MED ORDER — LEVOFLOXACIN 500 MG PO TABS: 500.0000 mg | ORAL_TABLET | Freq: Every day | ORAL | 0 refills | Status: AC

## 2021-07-24 MED ORDER — OXYCODONE HCL 5 MG PO TABS: 5.0000 mg | ORAL_TABLET | Freq: Four times a day (QID) | ORAL | 0 refills | Status: AC | PRN

## 2021-07-24 MED ORDER — ONDANSETRON 4 MG PO TBDP: 4.0000 mg | ORAL_TABLET | Freq: Three times a day (TID) | ORAL | 0 refills | Status: AC | PRN

## 2021-07-24 MED ORDER — HYDROMORPHONE HCL 1 MG/ML IJ SOLN
0.5000 mg | Freq: Once | INTRAMUSCULAR | Status: AC
Start: 2021-07-24 — End: 2021-07-24
  Administered 2021-07-24: 10:00:00 0.5 mg via INTRAVENOUS
  Filled 2021-07-24: qty 1

## 2021-07-24 MED ORDER — HYDROMORPHONE HCL 1 MG/ML IJ SOLN
0.5000 mg | Freq: Once | INTRAMUSCULAR | Status: AC
  Administered 2021-07-24: 10:00:00 0.5 mg via INTRAVENOUS
  Filled 2021-07-24: qty 1

## 2021-07-24 MED ORDER — SODIUM CHLORIDE 0.9 % IV BOLUS
500.0000 mL | Freq: Once | INTRAVENOUS | Status: AC
  Administered 2021-07-24: 10:00:00 500 mL via INTRAVENOUS

## 2021-07-24 MED ORDER — SODIUM CHLORIDE 0.9 % IV SOLN
2.0000 g | Freq: Once | INTRAVENOUS | Status: AC
  Administered 2021-07-24: 12:00:00 2 g via INTRAVENOUS
  Filled 2021-07-24: qty 20

## 2021-07-24 MED ORDER — ONDANSETRON HCL 4 MG/2ML IJ SOLN
4.0000 mg | Freq: Once | INTRAMUSCULAR | Status: AC
  Administered 2021-07-24: 10:00:00 4 mg via INTRAVENOUS
  Filled 2021-07-24: qty 2

## 2021-07-24 NOTE — ED Notes (Signed)
Pt transported to [facility]

## 2021-07-24 NOTE — ED Provider Notes (Signed)
[facility] Provider Note    Event Date/Time   First MD Initiated Contact with Patient 07/24/21 0915     (approximate)   History   Hematuria   HPI  Scott Holloway is a 56 y.o. male with history of a hypertension, thyroid issues who came in for testicle swelling.  Patient reports that he had some dysuria yesterday and developed a little bit of swelling and pain in his left testicle.  Today the pain is gotten much worse.  Stated he tried to bend over and he had severe shooting pain in the left testicle.  He denies any flank tenderness.  Does report a history of kidney stones and having pain in his testicles but no swelling in his testicle before.      Physical Exam   Triage Vital Signs: ED Triage Vitals  Enc Vitals Group     BP 07/24/21 0911 138/79     Pulse Rate 07/24/21 0911 (!) 102     Resp 07/24/21 0911 20     Temp 07/24/21 0911 99.7 F (37.6 C)     Temp Source 07/24/21 0911 Oral     SpO2 07/24/21 0911 97 %     Weight 07/24/21 0912 244 lb 0.8 oz (110.7 kg)     Height 07/24/21 0912 6' (1.829 m)     Head Circumference --      Peak Flow --      Pain Score 07/24/21 0911 10     Pain Loc --      Pain Edu? --      Excl. in [place]? --     Most recent vital signs: Vitals:   07/24/21 0911  BP: 138/79  Pulse: (!) 102  Resp: 20  Temp: 99.7 F (37.6 C)  SpO2: 97%     General: Awake, appears uncomfortable CV:  Good peripheral perfusion.  Tachycardic Resp:  Normal effort.  Abd:  No distention.  No CVA tenderness Other:  Right testicle nontender.  Left testicle slightly enlarged and tender to palpation   ED Results / Procedures / Treatments   Labs (all labs ordered are listed, but only abnormal results are displayed) Labs Reviewed  CBC - Abnormal; Notable for the following components:      Result Value   WBC 18.9 (*)    RBC 4.15 (*)    Hemoglobin 12.3 (*)    HCT 38.2 (*)    All other components within normal limits  CHLAMYDIA/NGC RT PCR  ([facility] ONLY)            RESP PANEL BY RT-PCR (FLU A&B, COVID) [id number]  URINALYSIS, ROUTINE W REFLEX MICROSCOPIC  BASIC METABOLIC PANEL    RADIOLOGY I have reviewed the [place] personally and agree with radiology read possible epididymitis   CT no kidney stone   PROCEDURES:  Critical Care performed: No  Procedures   MEDICATIONS ORDERED IN ED: Medications  HYDROmorphone (DILAUDID) injection 0.5 mg (0.5 mg Intravenous Given 07/24/21 0951)  ondansetron (ZOFRAN) injection 4 mg (4 mg Intravenous Given 07/24/21 0944)  sodium chloride 0.9 % bolus 500 mL (0 mLs Intravenous Stopped 07/24/21 1129)  HYDROmorphone (DILAUDID) injection 0.5 mg (0.5 mg Intravenous Given 07/24/21 0951)  cefTRIAXone (ROCEPHIN) 2 g in sodium chloride 0.9 % 100 mL IVPB (0 g Intravenous Stopped 07/24/21 1308)     IMPRESSION / MDM / ASSESSMENT AND PLAN / ED COURSE  I reviewed the triage vital signs and the nursing notes.  Differential diagnosis includes, but is not limited to, UTI, epididymitis, orchitis.  Seems less likely to be kidney stone given no flank tenderness but will proceed initially with ultrasound to evaluate the testicle.  We will get COVID, flu test.  Patient is in significant pain so patient was given some IV Dilaudid and IV Zofran  Ultrasound concerning for possible epididymitis but given patient's history of kidney stones with a little bit of discomfort going up that way I did a CT just to make sure there is no evidence of kidney stone given that would require stenting and it was negative.  With the epididymitis he reports being sexually active with his wife only and his gonorrhea chlamydia test were negative.  His urine looks consistent with UTI  White count was elevated consistent with infection on his CBC  CMP shows slightly elevated kidney function from 1.6-2.1 but patient was given some fluids and is tolerating p.o.  Patient meets sepsis criteria with elevated white count  and elevated heart rate so I did obtain blood cultures.  I very low suspicion for bacteremia at this time given he is very well-appearing.  I given a dose of IV ceftriaxone.  We discussed admission versus going home and patient felt more comfortable going home at this time given he is feeling much better.  He understands that if his blood cultures are positive they will call him to come back.  I reevaluated patient and he still would like to go home.  Confirmed with pharmacy Manuela Schwartz that based upon his creatinine clearance he is still okay with the levofloxacin once daily.   Discussed with patient not driving or working while on the oxycodone       FINAL CLINICAL IMPRESSION(S) / ED DIAGNOSES   Final diagnoses:  Swelling  Epididymitis  AKI (acute kidney injury) ([place])     Rx / DC Orders   ED Discharge Orders          Ordered    levofloxacin (LEVAQUIN) 500 MG tablet  Daily        07/24/21 1333    oxyCODONE (ROXICODONE) 5 MG immediate release tablet  Every 6 hours PRN        07/24/21 1333    ondansetron (ZOFRAN-ODT) 4 MG disintegrating tablet  Every 8 hours PRN        07/24/21 1336             Note:  This document was prepared using Dragon voice recognition software and may include unintentional dictation errors.   Vanessa [place], MD 07/24/21 [phone]

## 2021-07-24 NOTE — ED Triage Notes (Signed)
Pt here with left testicle swelling and hematuria since last night. Pt states he bent over to pick something up and started having severe pain. Pt states pain is similar to a kidney stone that he had 20 years ago. Pt in NAD in triage.

## 2021-07-24 NOTE — Discharge Instructions (Addendum)
Take Tylenol 1 g every 8 hours.  Avoid ibuprofen due to your kidney function.  Take the antibiotics and call urology to make a follow-up appointment.  Please drink plenty of fluid and return to the ER if you develop worsening pain, fevers or any other concerns.  Keep your phone on you we will call you if there is any issues with your blood cultures.  Please follow-up with urology or your primary care doctor in a few days to have your kidney function rechecked and return to the ER if develop worsening symptoms  IMPRESSION: 1. Left epididymal tail is enlarged and hypervascular. Small left hydrocele. Findings are suggestive for left epididymitis. 2. Normal appearance of both testicles without a testicular lesion and no evidence for torsion.  IMPRESSION: No acute abnormality.  Specifically, no urinary tract calculi.   Mildly enlarged prostate.

## 2021-07-26 LAB — URINE CULTURE: Culture: >=100000 — AB

## 2021-07-29 LAB — CULTURE, BLOOD (ROUTINE X 2)
Culture: NO GROWTH
Culture: NO GROWTH
Special Requests: ADEQUATE
Special Requests: ADEQUATE

## 2021-07-30 NOTE — Telephone Encounter (Signed)
Spoke with Scott Holloway and he started talking about another situation about "everything being swollen". He asked should he see his primary instead of [place] and I said he could if he chose or he could discuss the situation with [facility] when he rescheduled. He kept asking me questions that I could not answer, as I am not clinical, so I transferred him to triage. He chose to wait to be rescheduled to see if triage could get him in sooner than 2/8.

## 2021-08-03 NOTE — Progress Notes (Signed)
08/06/21 1:50 PM   Scott Holloway [birth year] [id number]  Referring provider:  Margo Common, PA-C No address on file Chief Complaint  Patient presents with   Follow-up    1 year follow-up    Urological history: High risk hematuria  -   Non contrast CT 03/2019 Both adrenal glands appear normal. There are indistinct hypodense lesions of both kidneys. One index lesion in the left mid kidney measures 2.4 by 1.6 cm on image 69/4 with internal density 13 Hounsfield units. No urinary tract calculi identified. No hydronephrosis or hydroureter. Urinary bladder unremarkable.  Upper normal size of the prostate gland. - MRI 05/2019 Multiple small benign-appearing bilateral renal cysts. No evidence of renal neoplasm or other significant abnormality.   -Cysto 03/2019 with Dr. Bernardo Heater NED - Followed by Nephrology  - Found to have proteinuria and blood in urine underwent biopsy of kidney with nephrology on 01/05/2021. 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   2. Elevated PSA  - PSA trend  Component Prostate Specific Ag, Serum  Latest Ref Rng & Units 0.0 - 4.0 ng/mL  11/04/2017 9.3 (H)  01/14/2018 2.5  12/10/2018 3.7  11/18/2019 2.9  04/19/2020 7.0 (H)  05/19/2020 3.8  11/20/2020 5.0 (H)  04/09/2021 3.6   3. BPH with LUTS  - Managed conservatively avoiding bladder irritants and timed voiding's  HPI: Scott Holloway is a 56 y.o.male who returns today for ED follow-up with IPSS and exam.   He was recently seen in the ED on 07/24/2021 for testicular swelling in left testicle. UA revealed large blood, large leukocytes, >50 RBCs, > 50 WBC, many bacteria, and non squamous epithelial cells were present. Urine culture grew E.coli that was ampicillin resistant. Scrotal ultrasound visualized epididymal tail is enlarged and hypervascular. Small left hydrocele. CT renal stone study visualized no acute abnormalities besides a mildly enlarged prostate. He was discharged on levofloxacin.    He has finished his Levaquin but he still feels tenderness in left scrotum. He states left scrotum is not as swollen he feels that the infection is not completely gone. He notes his achilles tendon is sore and he had to wear a boot on the left foot because of tenderness. He denies fever, chills, nausea and vomiting. He has no gross hematuria.   PMH: Past Medical History:  Diagnosis Date   Graves disease 11/03/2017   Hyperlipidemia 11/03/2017   Hypertension 11/03/2017   Hypothyroidism 11/03/2017   Pneumothorax 1997   Pneumothorax 07/02/1995    Surgical History: Past Surgical History:  Procedure Laterality Date   COLONOSCOPY WITH PROPOFOL     COLONOSCOPY WITH PROPOFOL N/A 12/17/2019   Procedure: COLONOSCOPY WITH PROPOFOL;  Surgeon: Virgel Manifold, MD;  Location: [facility] ENDOSCOPY;  Service: Endoscopy;  Laterality: N/A;    Home Medications:  Allergies as of 08/06/2021       Reactions   Poison Oak Extract Rash        Medication List        Accurate as of August 06, 2021  1:50 PM. If you have any questions, ask your nurse or doctor.          atorvastatin 80 MG tablet Commonly known as: LIPITOR Take 1 tablet (80 mg total) by mouth daily.   ezetimibe 10 MG tablet Commonly known as: Zetia Take 1 tablet (10 mg total) by mouth daily.   Fish Oil 1000 MG Caps Take by mouth.   levothyroxine 150 MCG tablet Commonly known as: Euthyrox Take 1 tablet (  150 mcg total) by mouth daily. Please schedule an office visit before anymore refills.   lisinopril 40 MG tablet Commonly known as: ZESTRIL Take 40 mg by mouth daily. What changed: Another medication with the same name was removed. Continue taking this medication, and follow the directions you see here. Changed by: Michiel Cowboy, PA-C   MULTIVITAMIN ADULT PO Take by mouth.   sulfamethoxazole-trimethoprim 800-160 MG tablet Commonly known as: BACTRIM DS Take 1 tablet by mouth every 12 (twelve) hours. Started by: Michiel Cowboy, PA-C        Allergies:  Allergies  Allergen Reactions   Poison Oak Extract Rash    Family History: Family History  Problem Relation Age of Onset   Hypertension Mother    Aneurysm Mother    Heart attack Mother    Lung cancer Father    Healthy Sister    Hypertension Brother    Healthy Son    Healthy Daughter    Hypertension Maternal Grandmother    Aneurysm Maternal Grandfather    Alzheimer's disease Paternal Grandmother     Social History:  reports that he has never smoked. He has never used smokeless tobacco. He reports that he does not drink alcohol and does not use drugs.   Physical Exam: BP 136/83    Pulse 76    Ht 6' (1.829 m)    Wt 238 lb (108 kg)    BMI 32.28 kg/m   Constitutional:  Well nourished. Alert and oriented, No acute distress. HEENT: [place] AT, mask in place  Trachea midline Cardiovascular: No clubbing, cyanosis, or edema. Respiratory: Normal respiratory effort, no increased work of breathing. GU: No CVA tenderness.  No bladder fullness or masses.  Patient with uncircumcised phallus. Foreskin easily retracted  Urethral meatus is patent.  No penile discharge. No penile lesions or rashes. Scrotum without lesions, cysts, rashes and/or edema.  Testicles are located scrotally bilaterally. No masses are appreciated in the testicles. Right epididymis is normal.   Left epididymis is tender is indurated Neurologic: Grossly intact, no focal deficits, moving all 4 extremities. Psychiatric: Normal mood and affect.  Laboratory Data:  Lab Results  Component Value Date   CREATININE 2.12 (H) 07/24/2021   Lab Results  Component Value Date   HGBA1C 6.2 (H) 05/17/2021   Component     Latest Ref Rng & Units 07/24/2021  WBC     4.0 - 10.5 K/uL 18.9 (H)  RBC     4.22 - 5.81 MIL/uL 4.15 (L)  Hemoglobin     13.0 - 17.0 g/dL 41.3 (L)  HCT     24.4 - 52.0 % 38.2 (L)  MCV     80.0 - 100.0 fL 92.0  MCH     26.0 - 34.0 pg 29.6  MCHC     30.0 - 36.0 g/dL 01.0   RDW     27.2 - 53.6 % 13.2  Platelets     150 - 400 K/uL 332  nRBC     0.0 - 0.2 % 0.0  Neutrophils     %   NEUT#     1.7 - 7.7 K/uL   Lymphocytes     %   Lymphocyte #     0.7 - 4.0 K/uL   Monocytes Relative     %   Monocyte #     0.1 - 1.0 K/uL   Eosinophil     %   Eosinophils Absolute     0.0 - 0.5 K/uL  Basophil     %   Basophils Absolute     0.0 - 0.1 K/uL   Immature Granulocytes     %   Abs Immature Granulocytes     0.00 - 0.07 K/uL   Lymphs     Not Estab. %   Monocytes     Not Estab. %   Eos     Not Estab. %   Basos     Not Estab. %   Monocytes Absolute     0.1 - 0.9 x10E3/uL   EOS (ABSOLUTE)     0.0 - 0.4 x10E3/uL   Immature Grans (Abs)     0.0 - 0.1 x10E3/uL   WBC, UA     0 - 5 /hpf   Epithelial Cells (non renal)     0 - 10 /hpf   Bacteria, UA     None seen/Few    Component     Latest Ref Rng & Units 07/24/2021  Sodium     135 - 145 mmol/L 136  Potassium     3.5 - 5.1 mmol/L 4.1  Chloride     98 - 111 mmol/L 104  CO2     22 - 32 mmol/L 24  Glucose     70 - 99 mg/dL 121 (H)  BUN     6 - 20 mg/dL 28 (H)  Creatinine     0.61 - 1.24 mg/dL 2.12 (H)  Calcium     8.9 - 10.3 mg/dL 9.2  GFR, Estimated     >60 mL/min 36 (L)  Anion gap     5 - 15 8   Component     Latest Ref Rng & Units 05/17/2021  TSH     0.450 - 4.500 uIU/mL 5.170 (H)  T4,Free(Direct)     0.82 - 1.77 ng/dL 1.02   Urinalysis Component      Organism ID, Bacteria  Latest Ref Rng & Units        12/10/2018      No growth  07/24/2021     9:30 AM ESCHERICHIA COLI (A)  07/24/2021     11:43 AM   07/24/2021     11:43 AM     Pertinent Imaging: CLINICAL DATA:  Swelling.  Pain in left testicle.   EXAM: SCROTAL ULTRASOUND   DOPPLER ULTRASOUND OF THE TESTICLES   TECHNIQUE: Complete ultrasound examination of the testicles, epididymis, and other scrotal structures was performed. Color and spectral Doppler ultrasound were also utilized to evaluate blood flow to  the testicles.   COMPARISON:  None.   FINDINGS: Right testicle   Measurements: 5.0 x 2.5 x 2.0 cm. No mass or microlithiasis visualized.   Left testicle   Measurements: 4.3 x 2.1 x 3.0 cm. No mass or microlithiasis visualized.   Right epididymis:  Normal in size and appearance.   Left epididymis: Left epididymal tail is enlarged and hypervascular.   Hydrocele:  Small left hydrocele.   Varicocele:  None visualized.   Pulsed Doppler interrogation of both testes demonstrates normal low resistance arterial and venous waveforms bilaterally.   IMPRESSION: 1. Left epididymal tail is enlarged and hypervascular. Small left hydrocele. Findings are suggestive for left epididymitis. 2. Normal appearance of both testicles without a testicular lesion and no evidence for torsion.     Electronically Signed   By: Markus Daft M.D.   On: 07/24/2021 10:43  CLINICAL DATA:  Flank pain, kidney stone suspected; left flank pain   EXAM: CT ABDOMEN AND  PELVIS WITHOUT CONTRAST   TECHNIQUE: Multidetector CT imaging of the abdomen and pelvis was performed following the standard protocol without IV contrast.   RADIATION DOSE REDUCTION: This exam was performed according to the departmental dose-optimization program which includes automated exposure control, adjustment of the mA and/or kV according to patient size and/or use of iterative reconstruction technique.   COMPARISON:  September 2020   FINDINGS: Lower chest: No acute abnormality.   Hepatobiliary: No focal liver abnormality is seen. No gallstones, gallbladder wall thickening, or biliary dilatation.   Pancreas: Unremarkable.   Spleen: Unremarkable.   Adrenals/Urinary Tract: Adrenals are unremarkable. There are bilateral renal cysts and too small to characterize low-density lesions probably reflecting additional cysts. No renal calculi. Ureters are normal in caliber without calculi. Bladder is poorly distended with mild wall  thickening.   Stomach/Bowel: Stomach is within normal limits. Bowel is normal in caliber. Normal appendix.   Vascular/Lymphatic: Mild atherosclerosis.  No enlarged nodes.   Reproductive: Mildly enlarged prostate.   Other: No free fluid. Fat containing umbilical and supraumbilical hernias.   Musculoskeletal: Mild degenerative changes of the included spine. No acute osseous abnormality.   IMPRESSION: No acute abnormality.  Specifically, no urinary tract calculi.   Mildly enlarged prostate.     Electronically Signed   By: Macy Mis M.D.   On: 07/24/2021 12:43   I have personally reviewed the images and agree with radiologist interpretation.   Assessment & Plan:    Left epididymitis  - He was started on Levaquin antibiotic was switched today to Bactrim which urine culture in ER  was sensitive to.  - Follow-up in 1 month  - May have to repeat PSA it might be elevated due to his recent infection   Return in about 1 month (around 09/03/2021) for symptom recheck .  [facility] [address], [place], [place] [address] [phone]  I,Kailey Littlejohn,acting as a scribe for [facility], PA-C.,have documented all relevant documentation on the behalf of Crisanto Nied, PA-C,as directed by  [facility], PA-C while in the presence of [place], PA-C.

## 2021-08-06 ENCOUNTER — Other Ambulatory Visit: Payer: Self-pay

## 2021-08-06 ENCOUNTER — Other Ambulatory Visit: Payer: Self-pay | Admitting: *Deleted

## 2021-08-06 ENCOUNTER — Encounter: Payer: Self-pay | Admitting: Urology

## 2021-08-06 ENCOUNTER — Other Ambulatory Visit
Admission: RE | Admit: 2021-08-06 | Discharge: 2021-08-06 | Disposition: A | Discharge status: Home / Self Care | Payer: BC Managed Care – PPO | Attending: Urology | Admitting: Urology

## 2021-08-06 ENCOUNTER — Ambulatory Visit: Payer: BC Managed Care – PPO | Admitting: Urology

## 2021-08-06 VITALS — BP 136/83 | HR 76 | Ht 72.0 in | Wt 238.0 lb

## 2021-08-06 DIAGNOSIS — N451 Epididymitis: Secondary | ICD-10-CM

## 2021-08-06 DIAGNOSIS — R310 Gross hematuria: Secondary | ICD-10-CM

## 2021-08-06 DIAGNOSIS — N453 Epididymo-orchitis: Secondary | ICD-10-CM

## 2021-08-06 DIAGNOSIS — R319 Hematuria, unspecified: Secondary | ICD-10-CM

## 2021-08-06 DIAGNOSIS — N401 Enlarged prostate with lower urinary tract symptoms: Secondary | ICD-10-CM | POA: Diagnosis not present

## 2021-08-06 DIAGNOSIS — R972 Elevated prostate specific antigen [PSA]: Secondary | ICD-10-CM | POA: Diagnosis not present

## 2021-08-06 LAB — URINALYSIS, COMPLETE (UACMP) WITH MICROSCOPIC
Bilirubin Urine: NEGATIVE
Glucose, UA: NEGATIVE mg/dL
Ketones, ur: NEGATIVE mg/dL
Leukocytes,Ua: NEGATIVE
Nitrite: NEGATIVE
Protein, ur: >300 mg/dL — AB
Specific Gravity, Urine: 1.02 (ref 1.005–1.030)
pH: 6 (ref 5.0–8.0)

## 2021-08-06 LAB — PSA: Prostatic Specific Antigen: 7.19 ng/mL — ABNORMAL HIGH (ref 0.00–4.00)

## 2021-08-06 MED ORDER — SULFAMETHOXAZOLE-TRIMETHOPRIM 800-160 MG PO TABS: 1.0000 | ORAL_TABLET | Freq: Two times a day (BID) | ORAL | 0 refills | Status: DC

## 2021-09-02 NOTE — Progress Notes (Signed)
09/03/21 ?1:14 PM  ? ?Scott Holloway ?September 08, 1965 ?[id number] ? ?Referring provider:  ?Alfredia Ferguson, PA-C ?[address] ?[place],  [place] [address] ? ?Chief Complaint  ?Patient presents with  ? Follow-up  ?  follow-up  ? ?Urological history  ?High risk hematuria  ?-   Non contrast CT 03/2019 Both adrenal glands appear normal. There are indistinct hypodense lesions of both kidneys. One index lesion in the left mid kidney measures 2.4 by 1.6 cm on image 69/4 with internal density 13 Hounsfield units. No urinary tract calculi identified. No hydronephrosis or hydroureter. Urinary bladder unremarkable.  Upper normal size of the prostate gland. ?- MRI 05/2019 Multiple small benign-appearing bilateral renal cysts. No evidence of renal neoplasm or other significant abnormality.   ?-Cysto 03/2019 with Dr. Lonna Cobb NED ?- Followed by Nephrology  ?- Found to have proteinuria and blood in urine underwent biopsy of kidney with nephrology on 01/05/2021. 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  ?  ?2. Elevated PSA  ?- PSA trend  ?Component Prostate Specific Ag, Serum  ?Latest Ref Rng & Units 0.0 - 4.0 ng/mL  ?11/04/2017 9.3 (H)  ?01/14/2018 2.5  ?12/10/2018 3.7  ?11/18/2019 2.9  ?04/19/2020 7.0 (H)  ?05/19/2020 3.8  ?11/20/2020 5.0 (H)  ?04/09/2021 3.6  ?08/06/2021 7.19 (H)  ?  ? ?3. BPH with LUTS  ?- Managed conservatively avoiding bladder irritants and timed voiding's ? ?HPI: ?Scott Holloway is a 56 y.o.male who presents today for a 1 month follow-up for symptom recheck.  ? ?He states his testicular is pretty much back to normal.  Patient denies any modifying or aggravating factors.  Patient denies any gross hematuria, dysuria or suprapubic/flank pain.  Patient denies any fevers, chills, nausea or vomiting.  ? ?PMH: ?Past Medical History:  ?Diagnosis Date  ? Graves disease 11/03/2017  ? Hyperlipidemia 11/03/2017  ? Hypertension 11/03/2017  ? Hypothyroidism 11/03/2017  ? Pneumothorax 1997  ? Pneumothorax  07/02/1995  ? ? ?Surgical History: ?Past Surgical History:  ?Procedure Laterality Date  ? COLONOSCOPY WITH PROPOFOL    ? COLONOSCOPY WITH PROPOFOL N/A 12/17/2019  ? Procedure: COLONOSCOPY WITH PROPOFOL;  Surgeon: Pasty Spillers, MD;  Location: [facility] ENDOSCOPY;  Service: Endoscopy;  Laterality: N/A;  ? ? ?Home Medications:  ?Allergies as of 09/03/2021   ? ?   Reactions  ? Poison Oak Extract Rash  ? ?  ? ?  ?Medication List  ?  ? ?  ? Accurate as of September 03, 2021  1:14 PM. If you have any questions, ask your nurse or doctor.  ?  ?  ? ?  ? ?STOP taking these medications   ? ?sulfamethoxazole-trimethoprim 800-160 MG tablet ?Commonly known as: BACTRIM DS ?Stopped by: Michiel Cowboy, PA-C ?  ? ?  ? ?TAKE these medications   ? ?atorvastatin 80 MG tablet ?Commonly known as: LIPITOR ?Take 1 tablet (80 mg total) by mouth daily. ?  ?ezetimibe 10 MG tablet ?Commonly known as: Zetia ?Take 1 tablet (10 mg total) by mouth daily. ?  ?Fish Oil 1000 MG Caps ?Take by mouth. ?  ?levothyroxine 150 MCG tablet ?Commonly known as: Euthyrox ?Take 1 tablet (150 mcg total) by mouth daily. Please schedule an office visit before anymore refills. ?  ?lisinopril 40 MG tablet ?Commonly known as: ZESTRIL ?Take 40 mg by mouth daily. ?  ?MULTIVITAMIN ADULT PO ?Take by mouth. ?  ? ?  ? ? ?Allergies:  ?Allergies  ?Allergen Reactions  ? Poison Oak Extract Rash  ? ? ?  Family History: ?Family History  ?Problem Relation Age of Onset  ? Hypertension Mother   ? Aneurysm Mother   ? Heart attack Mother   ? Lung cancer Father   ? Healthy Sister   ? Hypertension Brother   ? Healthy Son   ? Healthy Daughter   ? Hypertension Maternal Grandmother   ? Aneurysm Maternal Grandfather   ? Alzheimer's disease Paternal Grandmother   ? ? ?Social History:  reports that he has never smoked. He has never been exposed to tobacco smoke. He has never used smokeless tobacco. He reports that he does not drink alcohol and does not use drugs. ? ? ?Physical Exam: ?BP (!) 145/84    Pulse 74   Ht 6' (1.829 m)   Wt 242 lb (109.8 kg)   BMI 32.82 kg/m?   ?Constitutional:  Well nourished. Alert and oriented, No acute distress. ?HEENT: [place] AT, mask in place.  Trachea midline ?Cardiovascular: No clubbing, cyanosis, or edema. ?Respiratory: Normal respiratory effort, no increased work of breathing. ?GU: Scrotum without lesions, cysts, rashes and/or edema.  Testicles are located scrotally bilaterally. No masses are appreciated in the testicles. Right epididymis are normal.  Left epidiymis is enlarged, but not tender ?Neurologic: Grossly intact, no focal deficits, moving all 4 extremities. ?Psychiatric: Normal mood and affect.  ? ?Laboratory Data: ?N/A ? ? ?Pertinent Imaging: ?N/A ? ?Assessment & Plan:   ? ?1.  Left epididymitis ?-Resolving ? ?2.  Elevated PSA ?-PSA was drawn at the time of him being actively infected ?-We will recheck at return appointment in 2 months ? ?3.  High risk hematuria ?-We will recheck urinalysis to ensure the microscopic hematuria has resolved with treatment of infection when he returns in 2 months ? ?Return in about 2 months (around 11/03/2021) for UA, IPSS, SHIM, PSA and exam. ? ?Arya Boxley, PA-C  ? ?[facility] ?[address], [address] ?[place], [place] [address] ?[phone] ?

## 2021-09-03 ENCOUNTER — Other Ambulatory Visit: Payer: Self-pay

## 2021-09-03 ENCOUNTER — Encounter: Payer: Self-pay | Admitting: Urology

## 2021-09-03 ENCOUNTER — Ambulatory Visit: Payer: BC Managed Care – PPO | Admitting: Urology

## 2021-09-03 VITALS — BP 145/84 | HR 74 | Ht 72.0 in | Wt 242.0 lb

## 2021-09-03 DIAGNOSIS — R972 Elevated prostate specific antigen [PSA]: Secondary | ICD-10-CM

## 2021-09-03 DIAGNOSIS — N453 Epididymo-orchitis: Secondary | ICD-10-CM

## 2021-09-03 DIAGNOSIS — R319 Hematuria, unspecified: Secondary | ICD-10-CM | POA: Diagnosis not present

## 2021-11-04 NOTE — Progress Notes (Signed)
11/05/21 ?2:23 PM  ? ?Scott Holloway ?1966/06/17 ?[id number] ? ?Referring provider:  ?Alfredia Ferguson, PA-C ?[address] ?[place],  [place] [address] ? ?Urological history  ?1. High risk hematuria  ?-non-smoker ?-Non contrast CT 03/2019 Both adrenal glands appear normal. There are indistinct hypodense lesions of both kidneys. One index lesion in the left mid kidney measures 2.4 by 1.6 cm on image 69/4 with internal density 13 Hounsfield units. No urinary tract calculi identified. No hydronephrosis or hydroureter. Urinary bladder unremarkable.  Upper normal size of the prostate gland.  ?- MRI 05/2019 Multiple small benign-appearing bilateral renal cysts. No evidence of renal neoplasm or other significant abnormality.    ?-Cysto 03/2019 with Dr. Lonna Cobb NED  ?- Followed by Nephrology  ?- Found to have proteinuria and blood in urine underwent biopsy of kidney with nephrology on 01/05/2021. 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   ?2. Elevated PSA  ?- PSA trend   ?Component Prostate Specific Ag, Serum  ?Latest Ref Rng & Units 0.0 - 4.0 ng/mL  ?11/04/2017 9.3 (H)  ?01/14/2018 2.5  ?12/10/2018 3.7  ?11/18/2019 2.9  ?04/19/2020 7.0 (H)  ?05/19/2020 3.8  ?11/20/2020 5.0 (H)  ?04/09/2021 3.6  ?08/06/2021 7.19 (H)  ?Today's PSA pending ?   ?3. BPH with LUTS  ?-I PSS 12/2 ?- Managed conservatively avoiding bladder irritants and timed voiding's  ? ?4. ED  ?-contributing factors of age, BPH, HTN and HLD  ?-SHIM 25 ? ?Chief Complaint  ?Patient presents with  ? Follow-up  ?  follow-up  ?  ?HPI: ?Scott Holloway is a 56 y.o.male who presents today for 2 month follow-up with IPSS, UA, SHIM, PSA, and exam with his wife, Alvis Lemmings.  ? ?He is doing well today. His testicular pain has resolved he reports that one testicle is larger than the other.  ? ?Patient denies any modifying or aggravating factors.  Patient denies any gross hematuria, dysuria or suprapubic/flank  pain.  Patient denies any fevers, chills, nausea or vomiting.  ? ?UA 21-50 RBC's and few bacteria.   ? ? ? IPSS   ? ? Row Name 11/05/21 1300  ?  ?  ?  ? International Prostate Symptom Score  ? How often have you had the sensation of not emptying your bladder? More than half the time    ? How often have you had to urinate less than every two hours? About half the time    ? How often have you found you stopped and started again several times when you urinated? Less than half the time    ? How often have you found it difficult to postpone urination? Less than half the time    ? How often have you had a weak urinary stream? Not at All    ? How often have you had to strain to start urination? Not at All    ? How many times did you typically get up at night to urinate? 1 Time    ? Total IPSS Score 12    ?  ? Quality of Life due to urinary symptoms  ? If you were to spend the rest of your life with your urinary condition just the way it is now how would you feel about that? Mostly Satisfied    ? ?  ?  ? ?  ? ? ?Score:  ?1-7 Mild ?8-19 Moderate ?20-35 Severe ? ? ?Patient still having  spontaneous erections.  He denies any pain or curvature with erections.   ? ? SHIM   ? ? Row Name 11/05/21 1330  ?  ?  ?  ? SHIM: Over the last 6 months:  ? How do you rate your confidence that you could get and keep an erection? Very High    ? When you had erections with sexual stimulation, how often were your erections hard enough for penetration (entering your partner)? Almost Always or Always    ? During sexual intercourse, how often were you able to maintain your erection after you had penetrated (entered) your partner? Almost Always or Always    ? During sexual intercourse, how difficult was it to maintain your erection to completion of intercourse? Not Difficult    ? When you attempted sexual intercourse, how often was it satisfactory for you? Almost Always or Always    ?  ? SHIM Total Score  ? SHIM 25    ? ?  ?  ? ?  ? ? ? ? ?PMH: ?Past  Medical History:  ?Diagnosis Date  ? Graves disease 11/03/2017  ? Hyperlipidemia 11/03/2017  ? Hypertension 11/03/2017  ? Hypothyroidism 11/03/2017  ? Pneumothorax 1997  ? Pneumothorax 07/02/1995  ? ? ?Surgical History: ?Past Surgical History:  ?Procedure Laterality Date  ? COLONOSCOPY WITH PROPOFOL    ? COLONOSCOPY WITH PROPOFOL N/A 12/17/2019  ? Procedure: COLONOSCOPY WITH PROPOFOL;  Surgeon: Pasty Spillers, MD;  Location: [facility] ENDOSCOPY;  Service: Endoscopy;  Laterality: N/A;  ? ? ?Home Medications:  ?Allergies as of 11/05/2021   ? ?   Reactions  ? Poison Oak Extract Rash  ? ?  ? ?  ?Medication List  ?  ? ?  ? Accurate as of Nov 05, 2021  2:23 PM. If you have any questions, ask your nurse or doctor.  ?  ?  ? ?  ? ?atorvastatin 80 MG tablet ?Commonly known as: LIPITOR ?Take 1 tablet (80 mg total) by mouth daily. ?  ?ezetimibe 10 MG tablet ?Commonly known as: Zetia ?Take 1 tablet (10 mg total) by mouth daily. ?  ?Fish Oil 1000 MG Caps ?Take by mouth. ?  ?levothyroxine 150 MCG tablet ?Commonly known as: Euthyrox ?Take 1 tablet (150 mcg total) by mouth daily. Please schedule an office visit before anymore refills. ?  ?lisinopril 40 MG tablet ?Commonly known as: ZESTRIL ?Take 40 mg by mouth daily. ?  ?MULTIVITAMIN ADULT PO ?Take by mouth. ?  ? ?  ? ? ?Allergies:  ?Allergies  ?Allergen Reactions  ? Poison Oak Extract Rash  ? ? ?Family History: ?Family History  ?Problem Relation Age of Onset  ? Hypertension Mother   ? Aneurysm Mother   ? Heart attack Mother   ? Lung cancer Father   ? Healthy Sister   ? Hypertension Brother   ? Healthy Son   ? Healthy Daughter   ? Hypertension Maternal Grandmother   ? Aneurysm Maternal Grandfather   ? Alzheimer's disease Paternal Grandmother   ? ? ?Social History:  reports that he has never smoked. He has never been exposed to tobacco smoke. He has never used smokeless tobacco. He reports that he does not drink alcohol and does not use drugs. ? ? ?Physical Exam: ?BP (!) 153/85   Pulse 77   Ht  6' (1.829 m)   Wt 244 lb (110.7 kg)   BMI 33.09 kg/m?   ?Constitutional:  Alert and oriented, No acute distress. ?HEENT: [place] AT, moist  mucus membranes.  Trachea midline, no masses. ?Cardiovascular: No clubbing, cyanosis, or edema. ?Respiratory: Normal respiratory effort, no increased work of breathing. ?GU:  No CVA tenderness.  No bladder fullness or masses.  Patient with uncircumcised phallus. Foreskin easily retracted  Urethral meatus is patent.  No penile discharge. No penile lesions or rashes. Scrotum without lesions, cysts, rashes and/or edema.  Bilateral hydroceles, testicles are located scrotally bilaterally. No masses are appreciated in the testicles. Left and right epididymis are normal. ?Rectal: Patient with  normal sphincter tone. Anus and perineum without scarring or rashes. No rectal masses are appreciated. Prostate is approximately 60+ grams, could only palpate the apex, no nodules are appreciated. Seminal vesicles could not be palpated ?Skin: No rashes, bruises or suspicious lesions. ?Neurologic: Grossly intact, no focal deficits, moving all 4 extremities. ?Psychiatric: Normal mood and affect. ? ? ?Laboratory Data: ?Urinalysis ?Results for orders placed or performed during the hospital encounter of 11/05/21  ?Urinalysis, Complete w Microscopic  ?Result Value Ref Range  ? Color, Urine YELLOW YELLOW  ? APPearance CLEAR CLEAR  ? Specific Gravity, Urine 1.025 1.005 - 1.030  ? pH 6.0 5.0 - 8.0  ? Glucose, UA NEGATIVE NEGATIVE mg/dL  ? Hgb urine dipstick LARGE (A) NEGATIVE  ? Bilirubin Urine NEGATIVE NEGATIVE  ? Ketones, ur NEGATIVE NEGATIVE mg/dL  ? Protein, ur >300 (A) NEGATIVE mg/dL  ? Nitrite NEGATIVE NEGATIVE  ? Leukocytes,Ua NEGATIVE NEGATIVE  ? Squamous Epithelial / LPF 0-5 0 - 5  ? WBC, UA 0-5 0 - 5 WBC/hpf  ? RBC / HPF 21-50 0 - 5 RBC/hpf  ? Bacteria, UA FEW (A) NONE SEEN  ?I have reviewed the labs.  ? ?Assessment & Plan:   ? ?1. High risk hematuria ?-work up 2020 - bilateral cysts ?-renal biopsy  FSGS with marked glomerular megaly ?-no reports of gross heme ?-UA 21-50 RBC's  ?-urine culture ?-if urine culture is negative, will need to repeat upper tract imaging ?-he will not do a repeat cysto unless

## 2021-11-05 ENCOUNTER — Other Ambulatory Visit: Payer: Self-pay | Admitting: *Deleted

## 2021-11-05 ENCOUNTER — Encounter: Payer: Self-pay | Admitting: Urology

## 2021-11-05 ENCOUNTER — Ambulatory Visit: Payer: BC Managed Care – PPO | Admitting: Urology

## 2021-11-05 ENCOUNTER — Other Ambulatory Visit
Admission: RE | Admit: 2021-11-05 | Discharge: 2021-11-05 | Disposition: A | Discharge status: Home / Self Care | Payer: BC Managed Care – PPO | Attending: Urology | Admitting: Urology

## 2021-11-05 VITALS — BP 153/85 | HR 77 | Ht 72.0 in | Wt 244.0 lb

## 2021-11-05 DIAGNOSIS — N453 Epididymo-orchitis: Secondary | ICD-10-CM | POA: Diagnosis not present

## 2021-11-05 DIAGNOSIS — N138 Other obstructive and reflux uropathy: Secondary | ICD-10-CM | POA: Diagnosis not present

## 2021-11-05 DIAGNOSIS — R972 Elevated prostate specific antigen [PSA]: Secondary | ICD-10-CM

## 2021-11-05 DIAGNOSIS — R3129 Other microscopic hematuria: Secondary | ICD-10-CM | POA: Diagnosis not present

## 2021-11-05 DIAGNOSIS — N401 Enlarged prostate with lower urinary tract symptoms: Secondary | ICD-10-CM | POA: Insufficient documentation

## 2021-11-05 LAB — URINALYSIS, COMPLETE (UACMP) WITH MICROSCOPIC
Bilirubin Urine: NEGATIVE
Glucose, UA: NEGATIVE mg/dL
Ketones, ur: NEGATIVE mg/dL
Leukocytes,Ua: NEGATIVE
Nitrite: NEGATIVE
Protein, ur: >300 mg/dL — AB
Specific Gravity, Urine: 1.025 (ref 1.005–1.030)
pH: 6 (ref 5.0–8.0)

## 2021-11-05 LAB — PSA: Prostatic Specific Antigen: 2.18 ng/mL (ref 0.00–4.00)

## 2021-11-07 LAB — URINE CULTURE: Culture: NO GROWTH

## 2021-11-13 ENCOUNTER — Telehealth: Payer: Self-pay

## 2021-11-13 NOTE — Telephone Encounter (Signed)
Attempted to reach pt, unable to leave a message, VM full. Attempt #1. ?

## 2021-11-13 NOTE — Telephone Encounter (Signed)
-----   Message from Harle Battiest, PA-C sent at 11/12/2021  9:25 PM EDT ----- ?Please let Scott Holloway know that his urine culture was negative for infection, so we need to get him scheduled for a cystoscopy.  Would he be agreeable to have the cysto in the office if he was given a Valium prior? ?

## 2021-11-16 NOTE — Telephone Encounter (Addendum)
Attempt #2 - VM full. Will send MyChart msg.

## 2021-12-05 ENCOUNTER — Encounter: Payer: Self-pay | Admitting: Urology

## 2022-01-05 IMAGING — US US BIOPSY
1 series · 13 of 17 positions shown · non-contrast
Comparison: Renal ultrasound-06/06/2020;

INDICATION: Proteinuria of uncertain etiology. Please perform ultrasound-guided
renal biopsy for tissue diagnostic purposes.

EXAM:
ULTRASOUND GUIDED RENAL BIOPSY

[Series 1: us biopsy · 0.19mm/px · 13 of 17 slices shown]
[im 1/17]
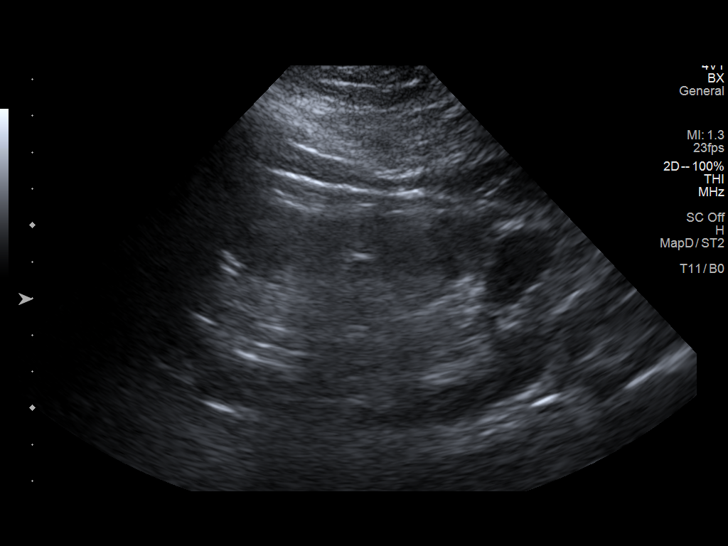
[im 2/17]
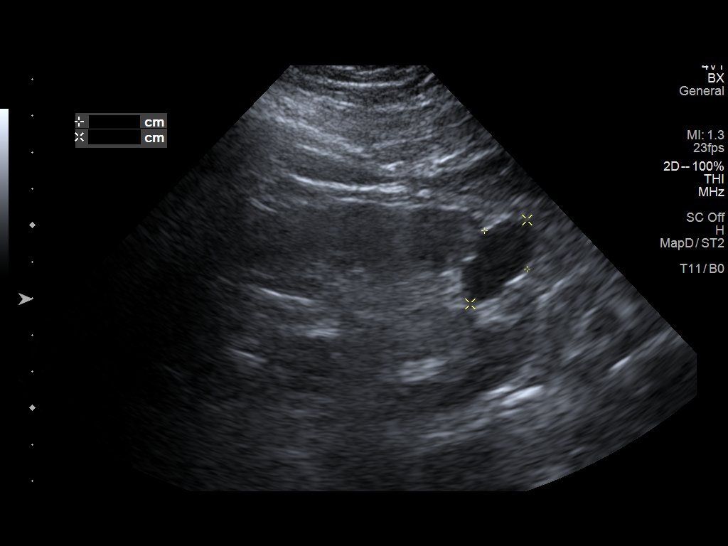
[im 4/17]
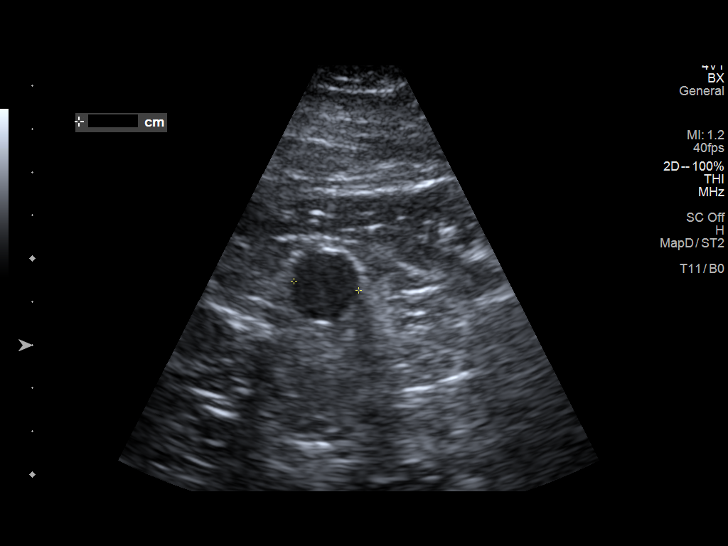
[im 5/17]
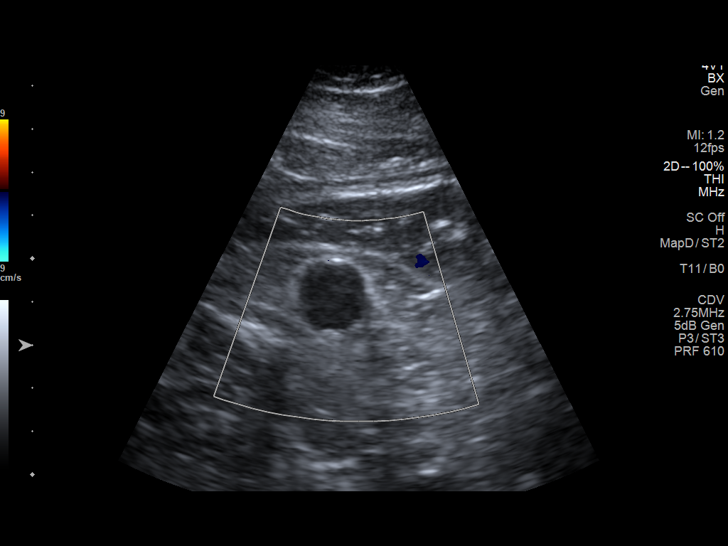
[im 6/17]
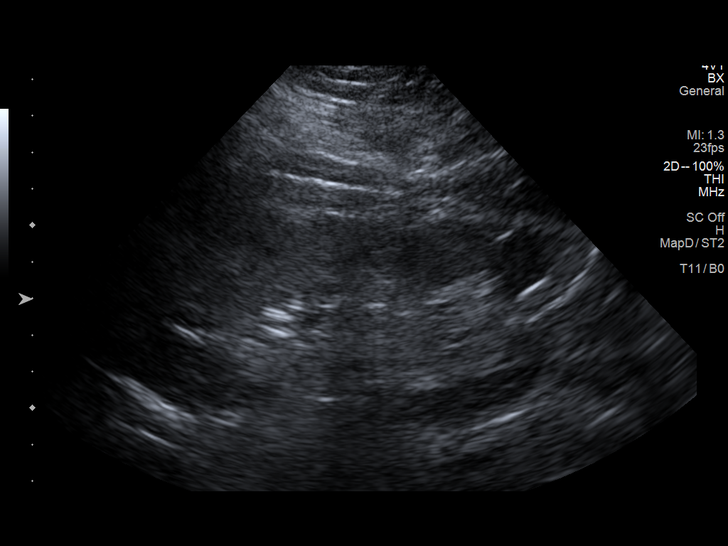
[im 8/17]
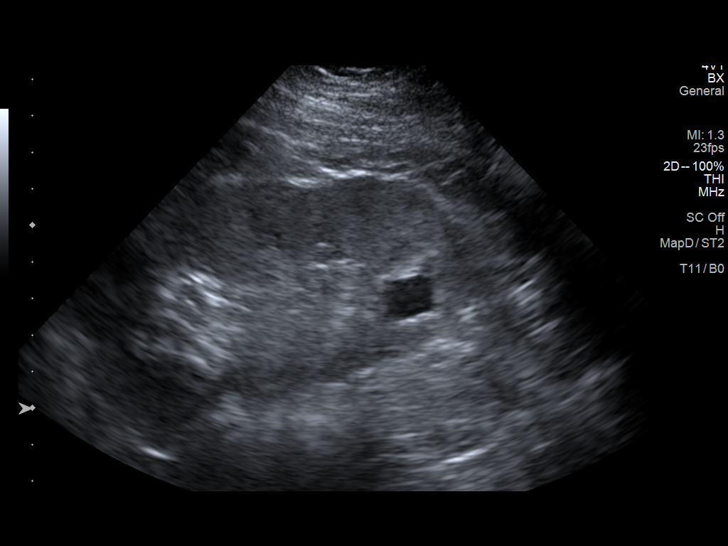
[im 9/17]
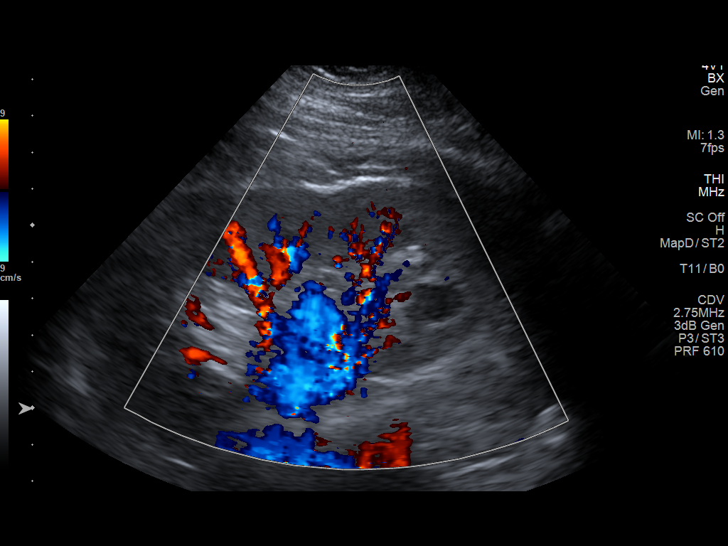
[im 10/17]
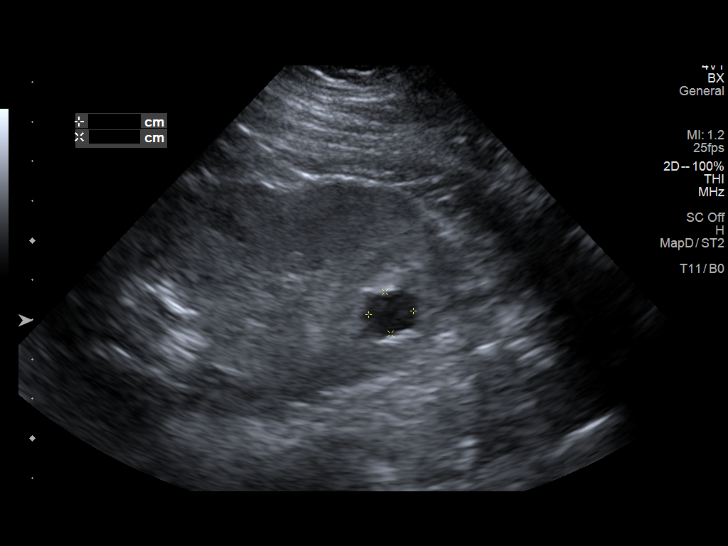
[im 12/17]
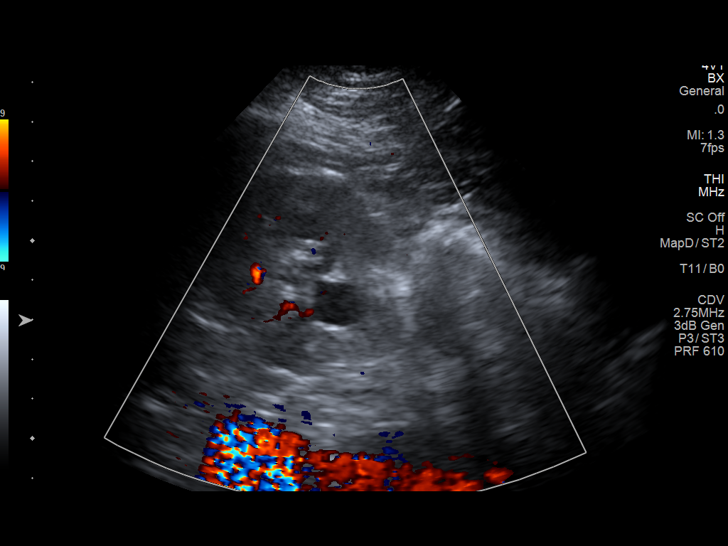
[im 13/17]
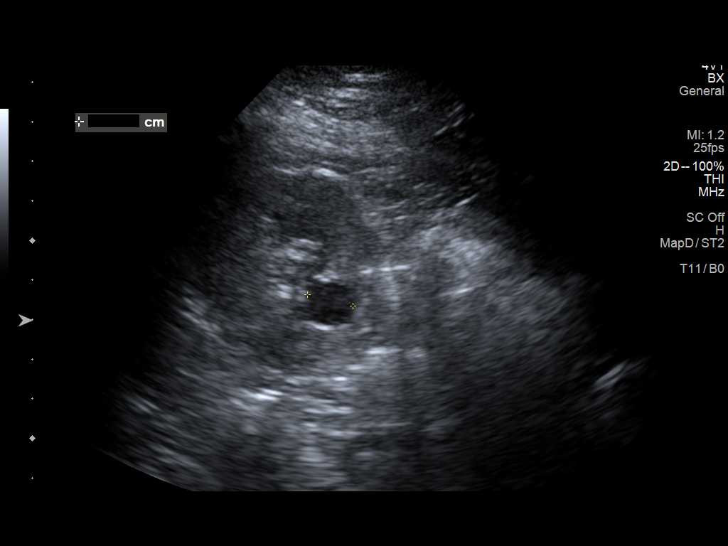
[im 14/17]
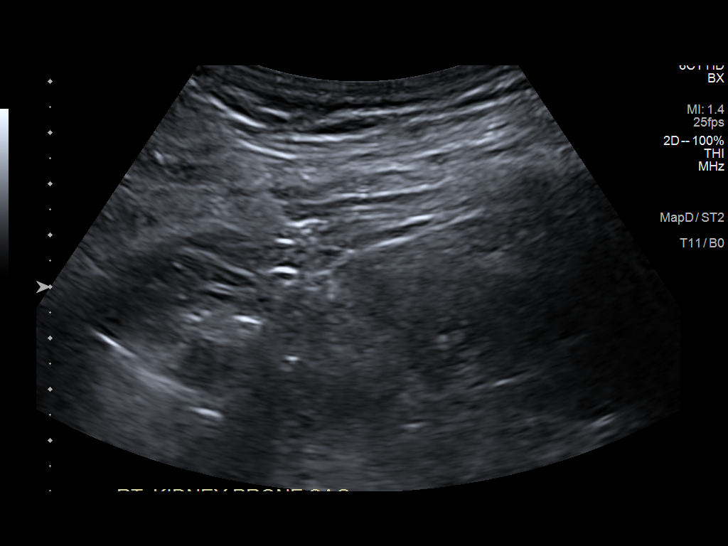
[im 16/17]
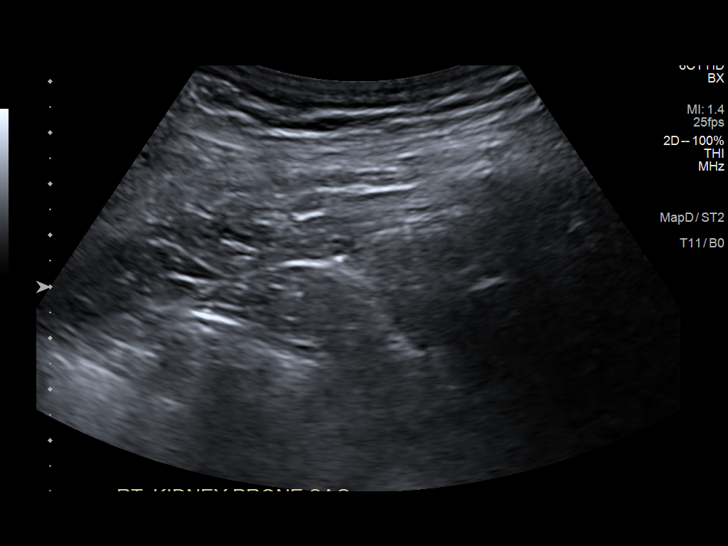
[im 17/17]
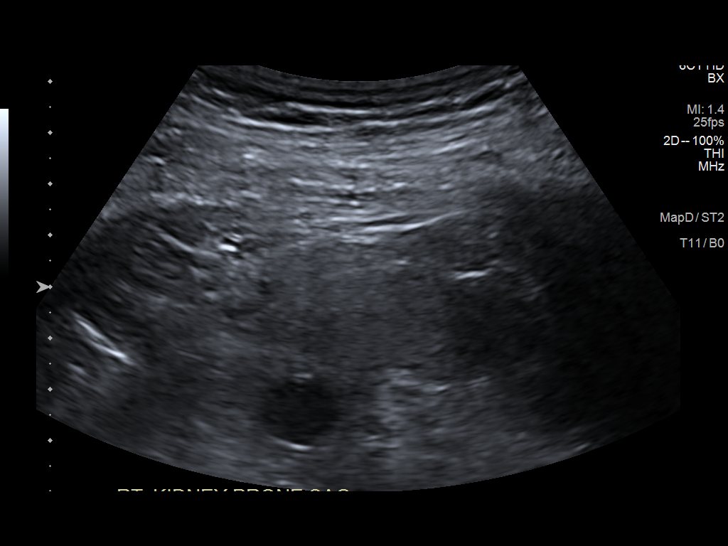

[13 of 17 positions shown; findings below may reference images not displayed]

CT abdomen and
pelvis-03/19/2019

MEDICATIONS:
None.

ANESTHESIA/SEDATION:
Fentanyl 100 mcg IV; Versed 2 mg IV

Total Moderate Sedation time: 12 minutes; The patient was
continuously monitored during the procedure by the interventional
radiology nurse under my direct supervision.

COMPLICATIONS:
None immediate.

PROCEDURE:
Informed written consent was obtained from the patient after a
discussion of the risks, benefits and alternatives to treatment. The
patient understands and consents the procedure. A timeout was
performed prior to the initiation of the procedure.

Ultrasound scanning was performed of the bilateral flanks.

Note is made of an approximately 2.8 x 1.6 x 1.5 cm partially
exophytic anechoic cyst arising from the inferior pole of the left
kidney as well as an approximately 1.2 x 1.1 x 1.1 cm right-sided
parapelvic cyst.

The inferior pole of the right kidney was selected for biopsy due to
location and sonographic window. The procedure was planned. The
operative site was prepped and draped in the usual sterile fashion.
The overlying soft tissues were anesthetized with 1% lidocaine with
epinephrine. A 17 gauge core needle biopsy device was advanced into
the inferior cortex of the right kidney and 3 core biopsies were
obtained under direct ultrasound guidance. Images were saved for
documentation purposes. The biopsy device was removed and hemostasis
was obtained with manual compression. Post procedural scanning was
negative for significant post procedural hemorrhage or additional
complication. A dressing was placed. The patient tolerated the
procedure well without immediate post procedural complication.
IMPRESSION: Technically successful ultrasound guided right renal biopsy.

## 2022-03-02 ENCOUNTER — Other Ambulatory Visit: Payer: Self-pay | Admitting: Physician Assistant

## 2022-03-02 DIAGNOSIS — E890 Postprocedural hypothyroidism: Secondary | ICD-10-CM

## 2022-03-05 ENCOUNTER — Telehealth: Payer: Self-pay | Admitting: Physician Assistant

## 2022-03-05 DIAGNOSIS — E890 Postprocedural hypothyroidism: Secondary | ICD-10-CM

## 2022-03-05 MED ORDER — LEVOTHYROXINE SODIUM 150 MCG PO TABS: 150.0000 ug | ORAL_TABLET | Freq: Every day | ORAL | 0 refills | Status: DC

## 2022-03-05 NOTE — Telephone Encounter (Signed)
[facility] pharmacy faxed refill request for the following medications:  levothyroxine (EUTHYROX) 150 MCG tablet   Please advise

## 2022-03-05 NOTE — Telephone Encounter (Signed)
Patient called, left VM to return the call to the office to scheduled an appt for medication refill request.   

## 2022-04-05 NOTE — Progress Notes (Signed)
I,April Miller,acting as a Education administrator for [employer], PA-C.,have documented all relevant documentation on the behalf of Mikey Kirschner, PA-C,as directed by  Mikey Kirschner, PA-C while in the presence of Mikey Kirschner, PA-C.   Established patient visit   Patient: Scott Holloway   DOB: 30-Jul-1965   56 y.o. Male  MRN: [id number] Visit Date: 04/08/2022  Today's healthcare provider: Mikey Kirschner, PA-C   Chief Complaint  Patient presents with   Follow-up   Hypertension   Hyperlipidemia   Hypothyroidism   Subjective    HPI  Hypertension, follow-up  BP Readings from Last 3 Encounters:  04/08/22 122/81  11/05/21 (!) 153/85  09/03/21 (!) 145/84   Wt Readings from Last 3 Encounters:  04/08/22 220 lb (99.8 kg)  11/05/21 244 lb (110.7 kg)  09/03/21 242 lb (109.8 kg)     He was last seen for hypertension 11 months ago.  BP at that visit was 128/78. Management since that visit includes no changes, he was to continue Lisinopril 10 mg.  Outside blood pressures are not checking.  Pertinent labs Lab Results  Component Value Date   CHOL 229 (H) 05/17/2021   HDL 49 05/17/2021   LDLCALC 150 (H) 05/17/2021   TRIG 166 (H) 05/17/2021   CHOLHDL 4.7 05/17/2021   Lab Results  Component Value Date   NA 136 07/24/2021   K 4.1 07/24/2021   CREATININE 2.12 (H) 07/24/2021   GFRNONAA 36 (L) 07/24/2021   GLUCOSE 121 (H) 07/24/2021   TSH 5.170 (H) 05/17/2021     The 10-year ASCVD risk score (Arnett DK, et al., 2019) is: 11.9%  --------------------------------------------------------------------------------------------------- Lipid/Cholesterol, Follow-up  Last lipid panel Other pertinent labs  Lab Results  Component Value Date   CHOL 229 (H) 05/17/2021   HDL 49 05/17/2021   LDLCALC 150 (H) 05/17/2021   TRIG 166 (H) 05/17/2021   CHOLHDL 4.7 05/17/2021   Lab Results  Component Value Date   ALT 14 05/17/2021   AST 21 05/17/2021   PLT 332 07/24/2021   TSH 5.170 (H) 05/17/2021      He was last seen for this 11 months ago.  Management since that visit included adding Zetia and continuing Atorvastatin.  Did not start Zetia and has no atorvastatin refills.   The 10-year ASCVD risk score (Arnett DK, et al., 2019) is: 11.9%  --------------------------------------------------------------------------------------------------- Hypothyroid, follow-up  Lab Results  Component Value Date   TSH 5.170 (H) 05/17/2021   TSH 0.655 11/18/2019   TSH 4.180 12/10/2018   FREET4 1.02 05/17/2021   FREET4 1.15 11/18/2019   T4TOTAL 6.7 12/10/2018   T4TOTAL 9.5 11/04/2017    Wt Readings from Last 3 Encounters:  04/08/22 220 lb (99.8 kg)  11/05/21 244 lb (110.7 kg)  09/03/21 242 lb (109.8 kg)    He was last seen for hypothyroid 11 months ago.  Management since that visit includes; no changes. ------------------------------------------------------------------------------------   Medications: Outpatient Medications Prior to Visit  Medication Sig   CALCIUM PO Take by mouth.   levothyroxine (EUTHYROX) 150 MCG tablet Take 1 tablet (150 mcg total) by mouth daily. Please schedule an office visit before anymore refills.   Multiple Vitamin (MULTIVITAMIN ADULT PO) Take by mouth.   Omega-3 Fatty Acids (FISH OIL) 1000 MG CAPS Take by mouth.   [DISCONTINUED] lisinopril (ZESTRIL) 40 MG tablet Take 40 mg by mouth daily.   [DISCONTINUED] atorvastatin (LIPITOR) 80 MG tablet Take 1 tablet (80 mg total) by mouth daily. (Patient not taking: Reported on 04/08/2022)   [  DISCONTINUED] ezetimibe (ZETIA) 10 MG tablet Take 1 tablet (10 mg total) by mouth daily. (Patient not taking: Reported on 04/08/2022)   No facility-administered medications prior to visit.    Review of Systems  Constitutional:  Negative for fatigue and fever.  Respiratory:  Negative for cough and shortness of breath.   Cardiovascular:  Negative for chest pain, palpitations and leg swelling.  Neurological:  Negative for  dizziness and headaches.       Objective    BP 122/81 (BP Location: Right Arm, Patient Position: Sitting, Cuff Size: Large)   Pulse 80   Resp 16   Wt 220 lb (99.8 kg)   SpO2 99%   BMI 29.84 kg/m    Physical Exam Constitutional:      General: He is awake.     Appearance: He is well-developed.  HENT:     Head: Normocephalic.  Eyes:     Conjunctiva/sclera: Conjunctivae normal.  Cardiovascular:     Rate and Rhythm: Normal rate and regular rhythm.     Heart sounds: Normal heart sounds.  Pulmonary:     Effort: Pulmonary effort is normal.     Breath sounds: Normal breath sounds.  Musculoskeletal:     Right lower leg: No edema.     Left lower leg: No edema.  Skin:    General: Skin is warm.  Neurological:     Mental Status: He is alert and oriented to person, place, and time.  Psychiatric:        Attention and Perception: Attention normal.        Mood and Affect: Mood normal.        Speech: Speech normal.        Behavior: Behavior is cooperative.      No results found for any visits on 04/08/22.  Assessment & Plan     Problem List Items Addressed This Visit       Cardiovascular and Mediastinum   Essential hypertension - Primary    Chronic and well controlled on medication. Continue at current dose F/u 6 mo cpe      Relevant Medications   atorvastatin (LIPITOR) 80 MG tablet   lisinopril (ZESTRIL) 40 MG tablet   Other Relevant Orders   Comprehensive metabolic panel     Endocrine   Postablative hypothyroidism    Previous tsh abnormal , repeat tsh/t4 and adjust levothyroxine as indicated        Relevant Orders   TSH + free T4     Other   Hyperlipidemia    Pt had ran out of atorvastatin 80 mg , but prior to then was consistent. Reports not taking zetia, never got at pharmacy.  Repeat lipids today.  The 10-year ASCVD risk score (Arnett DK, et al., 2019) is: 11.9%       Relevant Medications   atorvastatin (LIPITOR) 80 MG tablet   lisinopril (ZESTRIL)  40 MG tablet   Other Relevant Orders   Comprehensive metabolic panel   Lipid Panel With LDL/HDL Ratio   Elevated PSA    Pt follows with urology       Anemia    May have been 2/2 hematuria, follows with urology, repeat cbc      Relevant Orders   CBC with Differential/Platelet   Pt declined flu and shingles vaccine.  Return in about 6 months (around 10/08/2022) for chronic conditions.      I, Mikey Kirschner, PA-C have reviewed all documentation for this visit. The documentation on  04/08/2022 for  the exam, diagnosis, procedures, and orders are all accurate and complete.  Mikey Kirschner, PA-C [facility] [address] [place], [place], [address] Office: [phone] Fax: [phone]

## 2022-04-08 ENCOUNTER — Encounter: Payer: Self-pay | Admitting: Physician Assistant

## 2022-04-08 ENCOUNTER — Ambulatory Visit: Payer: BC Managed Care – PPO | Admitting: Physician Assistant

## 2022-04-08 VITALS — BP 122/81 | HR 80 | Resp 16 | Wt 220.0 lb

## 2022-04-08 DIAGNOSIS — E039 Hypothyroidism, unspecified: Secondary | ICD-10-CM

## 2022-04-08 DIAGNOSIS — E890 Postprocedural hypothyroidism: Secondary | ICD-10-CM | POA: Diagnosis not present

## 2022-04-08 DIAGNOSIS — E782 Mixed hyperlipidemia: Secondary | ICD-10-CM | POA: Diagnosis not present

## 2022-04-08 DIAGNOSIS — R972 Elevated prostate specific antigen [PSA]: Secondary | ICD-10-CM

## 2022-04-08 DIAGNOSIS — I10 Essential (primary) hypertension: Secondary | ICD-10-CM

## 2022-04-08 DIAGNOSIS — D649 Anemia, unspecified: Secondary | ICD-10-CM | POA: Insufficient documentation

## 2022-04-08 DIAGNOSIS — Z23 Encounter for immunization: Secondary | ICD-10-CM

## 2022-04-08 MED ORDER — LISINOPRIL 40 MG PO TABS: 40.0000 mg | ORAL_TABLET | Freq: Every day | ORAL | 1 refills | Status: DC

## 2022-04-08 MED ORDER — ATORVASTATIN CALCIUM 80 MG PO TABS: 80.0000 mg | ORAL_TABLET | Freq: Every day | ORAL | 3 refills | Status: DC

## 2022-04-08 NOTE — Assessment & Plan Note (Addendum)
Previous tsh abnormal , repeat tsh/t4 and adjust levothyroxine as indicated

## 2022-04-08 NOTE — Assessment & Plan Note (Addendum)
Pt had ran out of atorvastatin 80 mg , but prior to then was consistent. Reports not taking zetia, never got at pharmacy.  Repeat lipids today.  The 10-year ASCVD risk score (Arnett DK, et al., 2019) is: 11.9%

## 2022-04-08 NOTE — Assessment & Plan Note (Signed)
Pt follows with urology 

## 2022-04-08 NOTE — Assessment & Plan Note (Signed)
Chronic and well controlled on medication. Continue at current dose F/u 6 mo cpe

## 2022-04-08 NOTE — Assessment & Plan Note (Signed)
May have been 2/2 hematuria, follows with urology, repeat cbc

## 2022-04-09 ENCOUNTER — Other Ambulatory Visit: Payer: Self-pay | Admitting: Physician Assistant

## 2022-04-09 DIAGNOSIS — E890 Postprocedural hypothyroidism: Secondary | ICD-10-CM

## 2022-04-09 LAB — COMPREHENSIVE METABOLIC PANEL
ALT: 10 IU/L (ref 0–44)
AST: 20 IU/L (ref 0–40)
Albumin/Globulin Ratio: 1.5 (ref 1.2–2.2)
Albumin: 4 g/dL (ref 3.8–4.9)
Alkaline Phosphatase: 75 IU/L (ref 44–121)
BUN/Creatinine Ratio: 14 (ref 9–20)
BUN: 21 mg/dL (ref 6–24)
Bilirubin Total: 0.4 mg/dL (ref 0.0–1.2)
CO2: 27 mmol/L (ref 20–29)
Calcium: 9.7 mg/dL (ref 8.7–10.2)
Chloride: 104 mmol/L (ref 96–106)
Creatinine, Ser: 1.51 mg/dL — ABNORMAL HIGH (ref 0.76–1.27)
Globulin, Total: 2.7 g/dL (ref 1.5–4.5)
Glucose: 90 mg/dL (ref 70–99)
Potassium: 4.3 mmol/L (ref 3.5–5.2)
Sodium: 142 mmol/L (ref 134–144)
Total Protein: 6.7 g/dL (ref 6.0–8.5)
eGFR: 53 mL/min/{1.73_m2} — ABNORMAL LOW (ref >59)

## 2022-04-09 LAB — TSH+FREE T4
Free T4: 1.29 ng/dL (ref 0.82–1.77)
TSH: 0.59 u[IU]/mL (ref 0.450–4.500)

## 2022-04-09 LAB — CBC WITH DIFFERENTIAL/PLATELET
Basophils Absolute: 0.1 10*3/uL (ref 0.0–0.2)
Basos: 1 %
EOS (ABSOLUTE): 0.3 10*3/uL (ref 0.0–0.4)
Eos: 3 %
Hematocrit: 39.7 % (ref 37.5–51.0)
Hemoglobin: 12.8 g/dL — ABNORMAL LOW (ref 13.0–17.7)
Immature Grans (Abs): 0 10*3/uL (ref 0.0–0.1)
Immature Granulocytes: 0 %
Lymphocytes Absolute: 3 10*3/uL (ref 0.7–3.1)
Lymphs: 32 %
MCH: 28.7 pg (ref 26.6–33.0)
MCHC: 32.2 g/dL (ref 31.5–35.7)
MCV: 89 fL (ref 79–97)
Monocytes Absolute: 0.6 10*3/uL (ref 0.1–0.9)
Monocytes: 7 %
Neutrophils Absolute: 5.3 10*3/uL (ref 1.4–7.0)
Neutrophils: 57 %
Platelets: 268 10*3/uL (ref 150–450)
RBC: 4.46 x10E6/uL (ref 4.14–5.80)
RDW: 13 % (ref 11.6–15.4)
WBC: 9.2 10*3/uL (ref 3.4–10.8)

## 2022-04-09 LAB — LIPID PANEL WITH LDL/HDL RATIO
Cholesterol, Total: 171 mg/dL (ref 100–199)
HDL: 40 mg/dL (ref >39)
LDL Chol Calc (NIH): 108 mg/dL — ABNORMAL HIGH (ref 0–99)
LDL/HDL Ratio: 2.7 ratio (ref 0.0–3.6)
Triglycerides: 126 mg/dL (ref 0–149)
VLDL Cholesterol Cal: 23 mg/dL (ref 5–40)

## 2022-04-09 MED ORDER — LEVOTHYROXINE SODIUM 150 MCG PO TABS: 150.0000 ug | ORAL_TABLET | Freq: Every day | ORAL | 1 refills | Status: DC

## 2022-04-15 ENCOUNTER — Telehealth: Payer: Self-pay | Admitting: *Deleted

## 2022-04-15 NOTE — Telephone Encounter (Signed)
Results and provider comments given to pt. Questions answered, verbalized understanding.   Mikey Kirschner, PA-C  04/09/2022  9:09 AM EDT     Normal/stable labs. Thyroid levels are normal, refilling current dose    Pt would like to have rx for Calcium, the rx was from a historical provider. It can not be requested due to no dose listed. Pt asking that you send in a rx for Calcium.

## 2022-04-23 NOTE — Telephone Encounter (Signed)
Tried calling patient. No answer or voice message system. Unable to leave message.

## 2022-05-02 DIAGNOSIS — R051 Acute cough: Secondary | ICD-10-CM | POA: Diagnosis not present

## 2022-05-02 DIAGNOSIS — J019 Acute sinusitis, unspecified: Secondary | ICD-10-CM | POA: Diagnosis not present

## 2022-05-13 ENCOUNTER — Other Ambulatory Visit: Payer: Self-pay | Admitting: Urology

## 2022-05-14 LAB — PSA: Prostate Specific Ag, Serum: 3.2 ng/mL (ref 0.0–4.0)

## 2022-05-15 ENCOUNTER — Telehealth: Payer: Self-pay

## 2022-05-15 NOTE — Telephone Encounter (Signed)
Patient informed PSA results, 3.2, needs to continue to follow-up with urologist. Patient verbalized understanding.

## 2022-07-24 IMAGING — CT CT RENAL STONE PROTOCOL
2 of 4 series · 16 of 46 positions shown, 18 images · non-contrast
Comparison: March 2019

CLINICAL DATA: Flank pain, kidney stone suspected; left flank pain



[Series 2: stone full standard · axial · 0.82mm/px · z∈[-585,-115]mm · 13 of 104 slices shown, 15 images]
[im 5/104  soft-tissue]
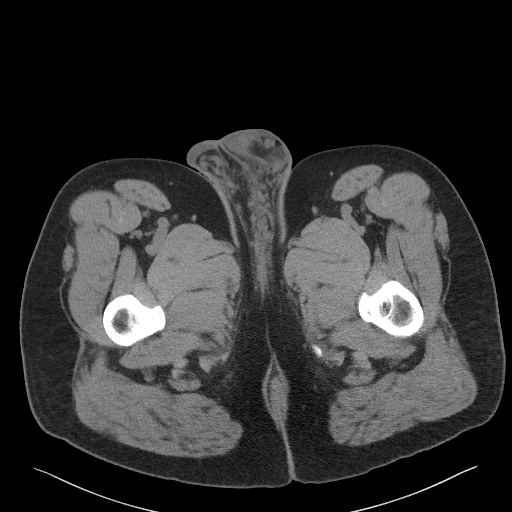
[im 5/104  bone]
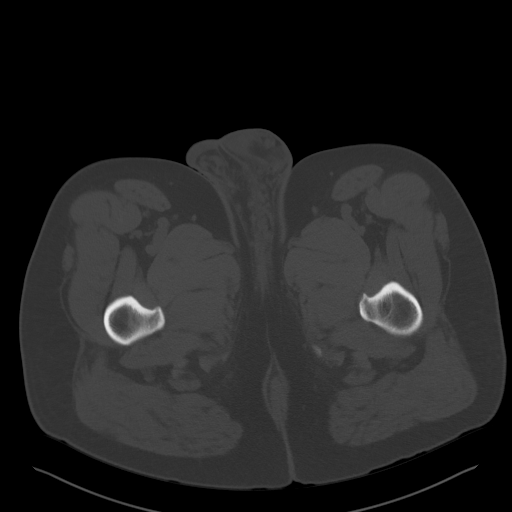
[im 15/104  soft-tissue]
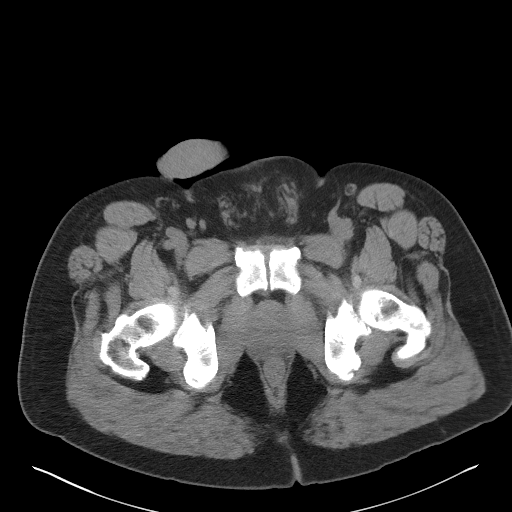
[im 24/104  soft-tissue]
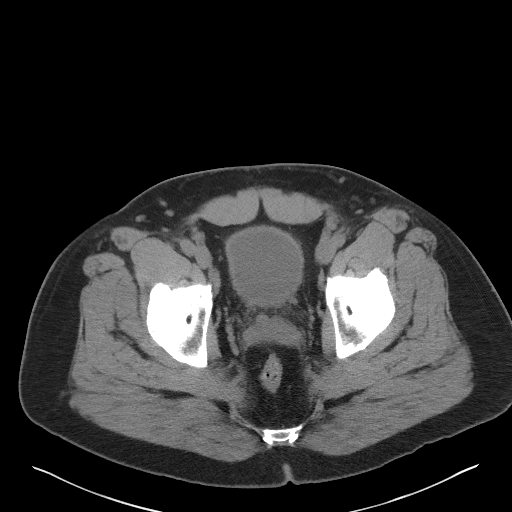
[im 29/104  soft-tissue]
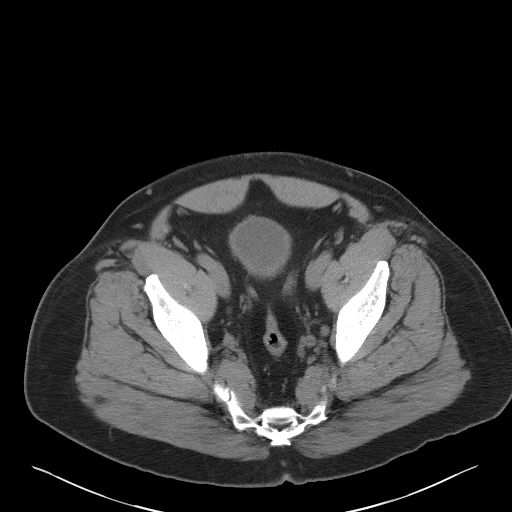
[im 38/104  soft-tissue]
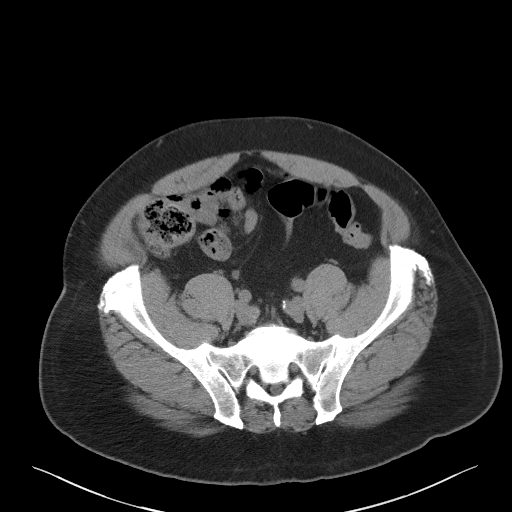
[im 43/104  soft-tissue]
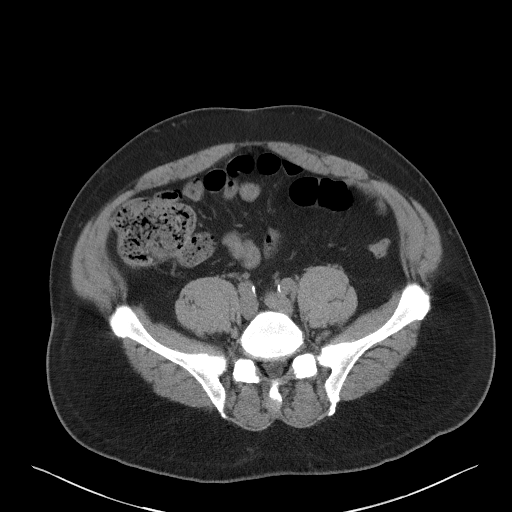
[im 52/104  soft-tissue]
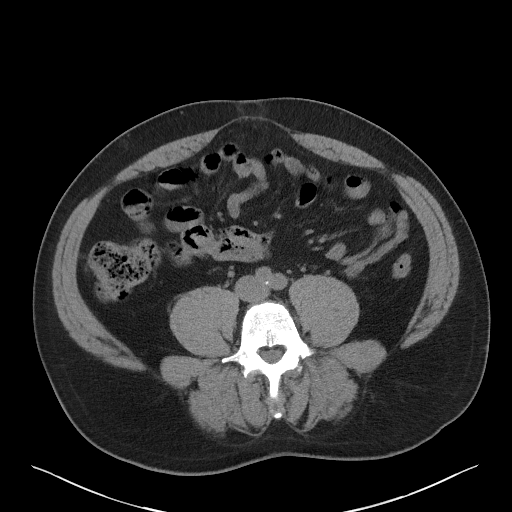
[im 61/104  soft-tissue]
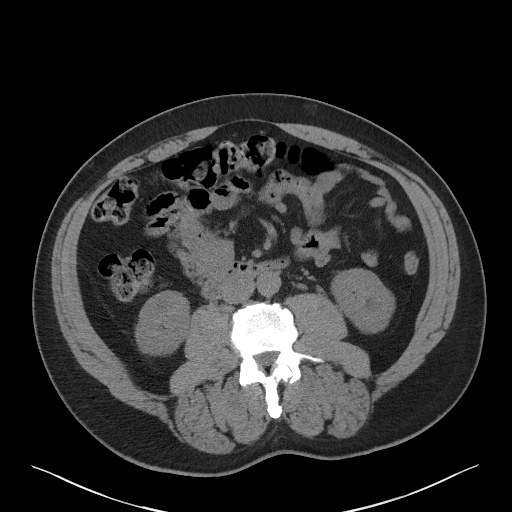
[im 66/104  soft-tissue]
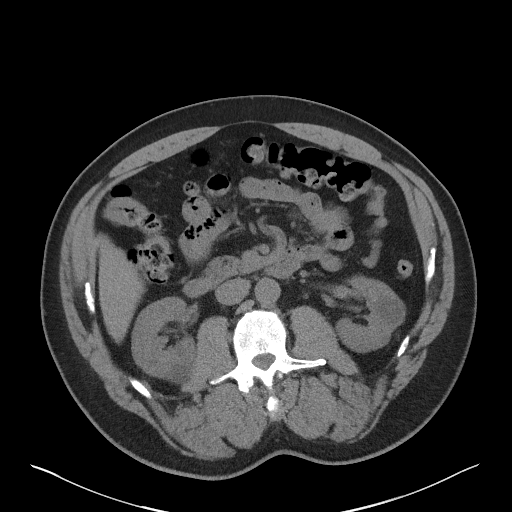
[im 66/104  bone]
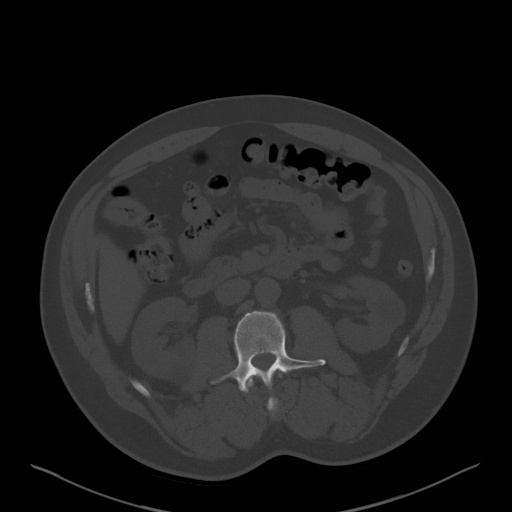
[im 75/104  soft-tissue]
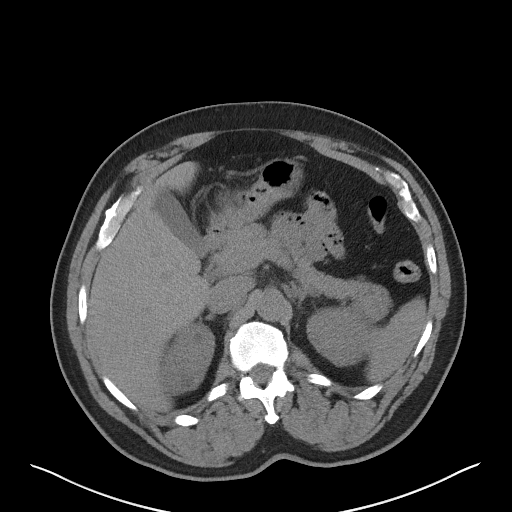
[im 80/104  soft-tissue]
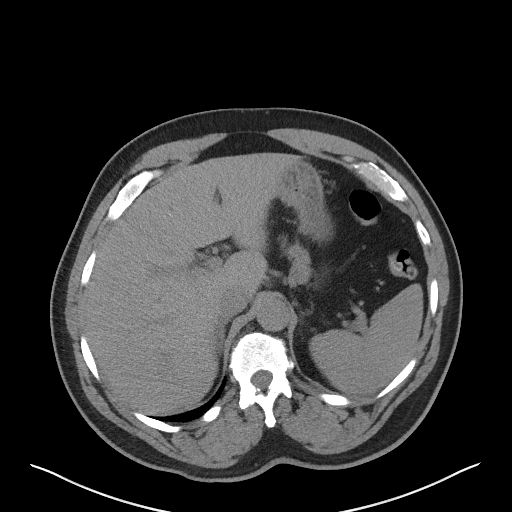
[im 89/104  soft-tissue]
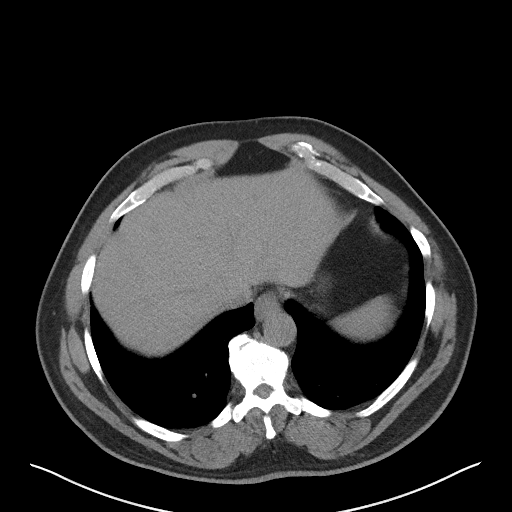
[im 99/104  soft-tissue]
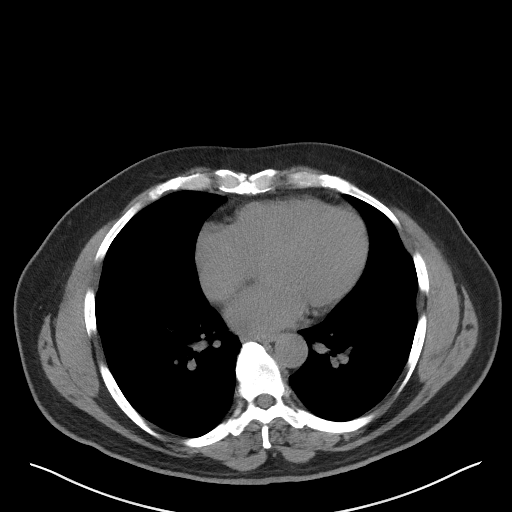

[Series 5: coronal · coronal · 0.81mm/px · 3 of 156 slices shown]
[im 52/156  soft-tissue]
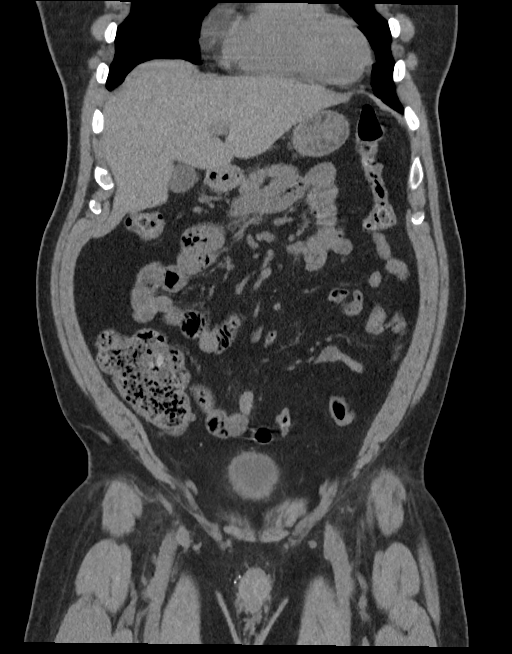
[im 69/156  soft-tissue]
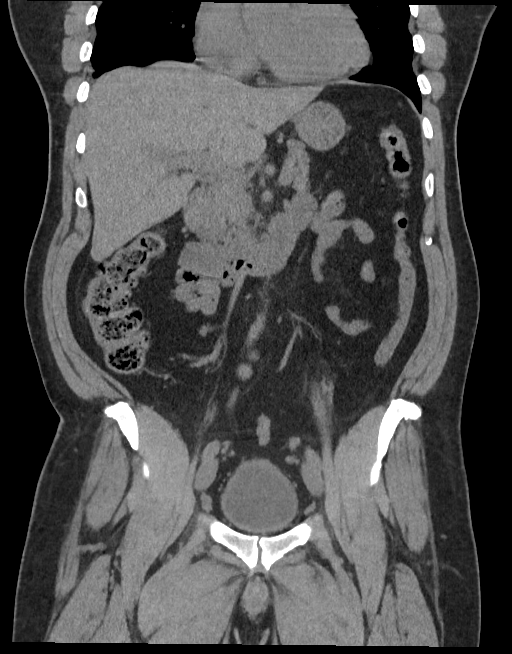
[im 87/156  soft-tissue]
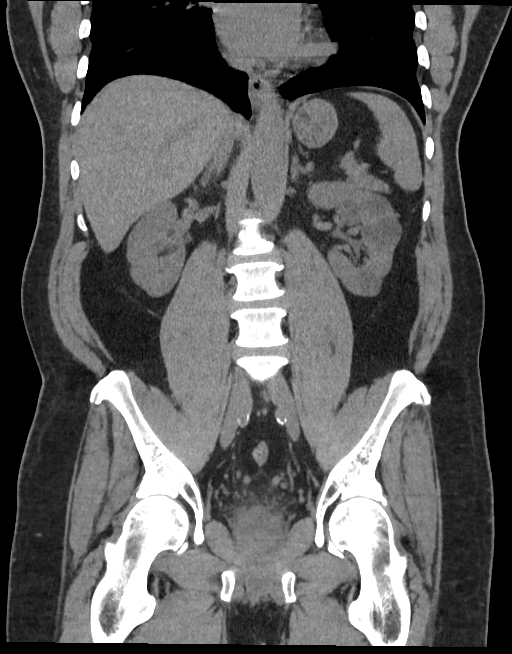

[16 of 46 positions shown; findings below may reference images not displayed]

FINDINGS: Lower chest: No acute abnormality.

Hepatobiliary: No focal liver abnormality is seen. No gallstones,
gallbladder wall thickening, or biliary dilatation.

Pancreas: Unremarkable.

Spleen: Unremarkable.

Adrenals/Urinary Tract: Adrenals are unremarkable. There are
bilateral renal cysts and too small to characterize low-density
lesions probably reflecting additional cysts. No renal calculi.
Ureters are normal in caliber without calculi. Bladder is poorly
distended with mild wall thickening.

Stomach/Bowel: Stomach is within normal limits. Bowel is normal in
caliber. Normal appendix.

Vascular/Lymphatic: Mild atherosclerosis.  No enlarged nodes.

Reproductive: Mildly enlarged prostate.

Other: No free fluid. Fat containing umbilical and supraumbilical
hernias.

Musculoskeletal: Mild degenerative changes of the included spine. No
acute osseous abnormality.
IMPRESSION: No acute abnormality.  Specifically, no urinary tract calculi.

Mildly enlarged prostate.

## 2022-07-24 IMAGING — US US SCROTUM W/ DOPPLER COMPLETE
1 series · 14 of 25 positions shown · non-contrast
Comparison: None.

CLINICAL DATA: Swelling.  Pain in left testicle.

EXAM:
SCROTAL ULTRASOUND
DOPPLER ULTRASOUND OF THE TESTICLES
TECHNIQUE: Complete ultrasound examination of the testicles, epididymis, and
other scrotal structures was performed. Color and spectral Doppler
ultrasound were also utilized to evaluate blood flow to the
testicles.

[Series 1: us scrotum w/doppler · 14 of 37 slices shown]
[im 1/37]
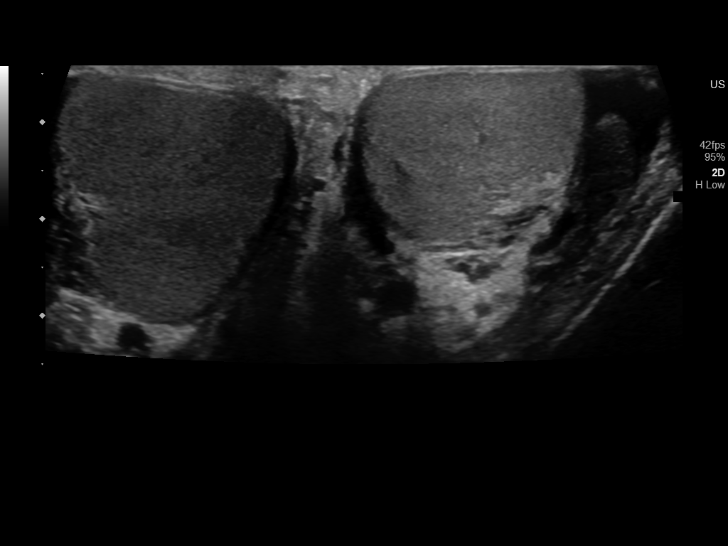
[im 4/37]
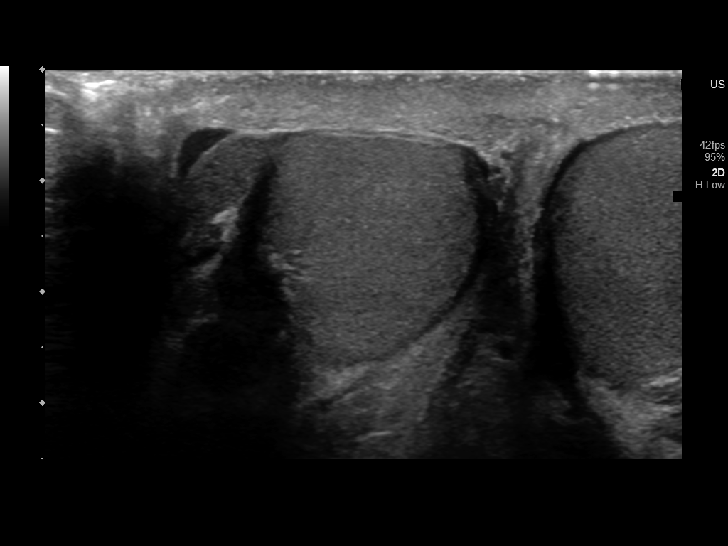
[im 7/37]
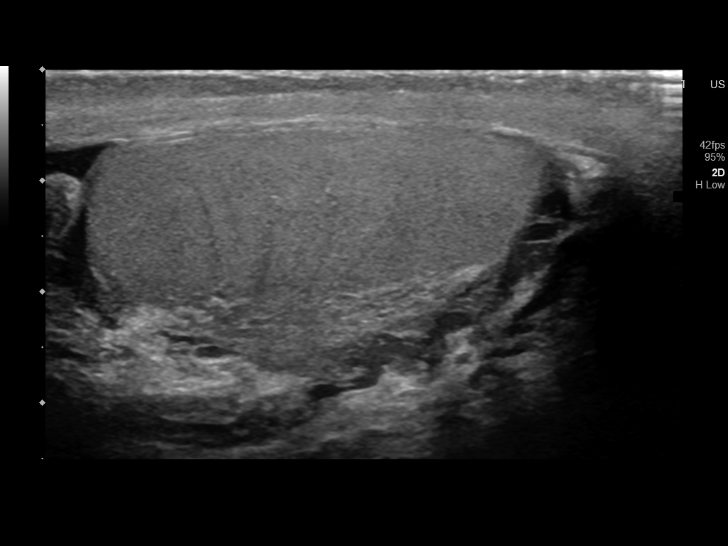
[im 10/37]
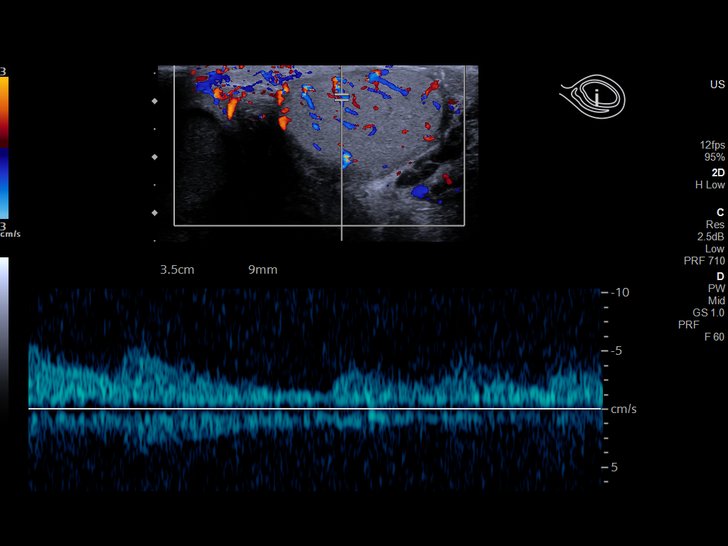
[im 13/37]
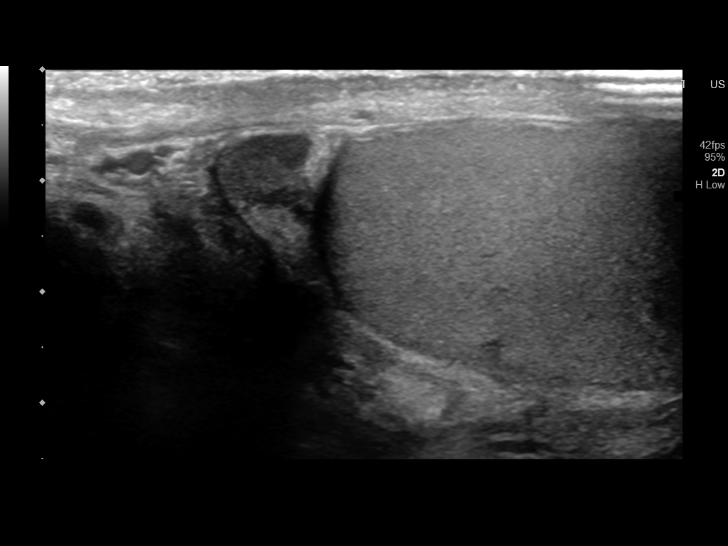
[im 14/37]
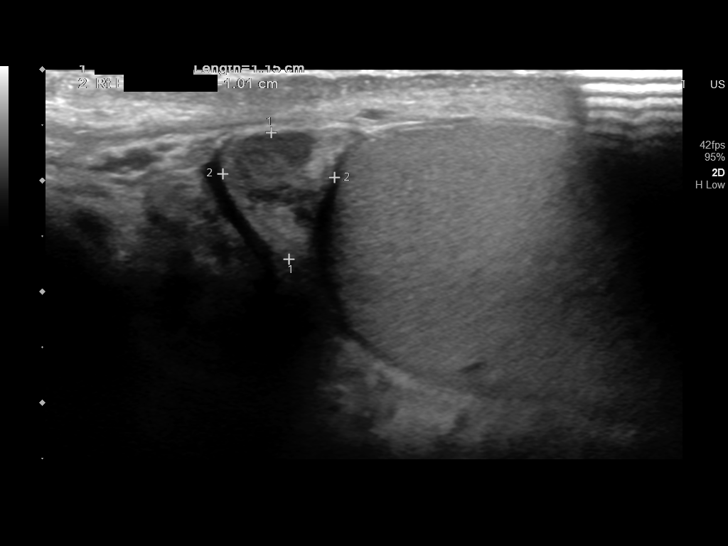
[im 17/37]
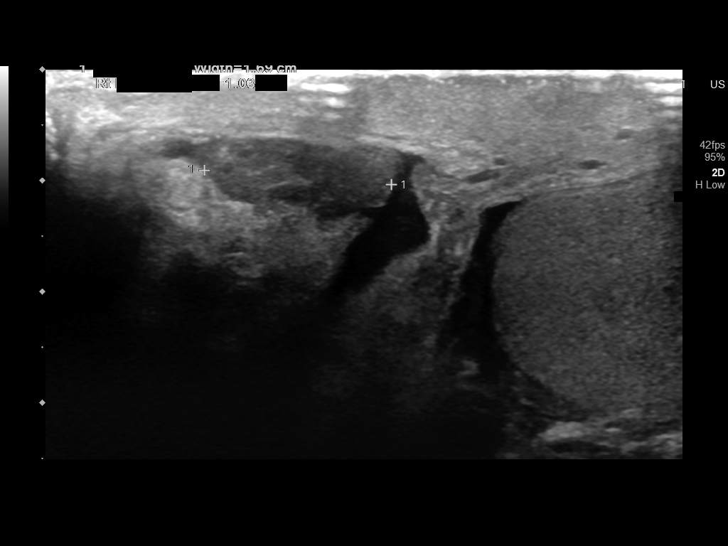
[im 20/37]
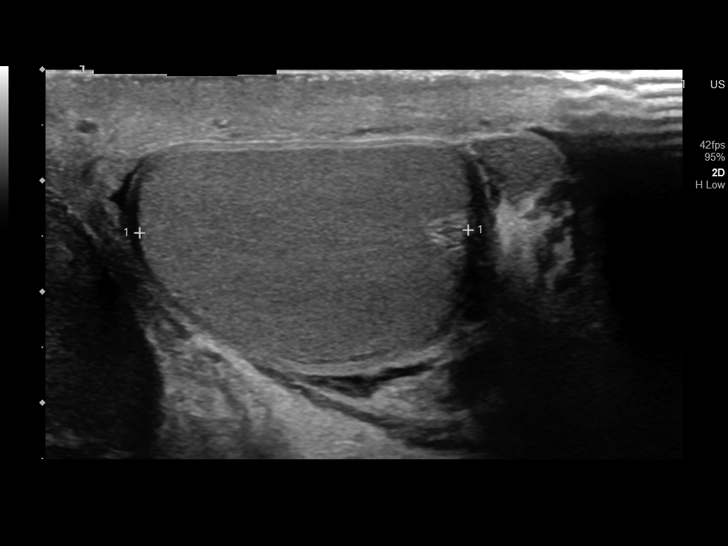
[im 23/37]
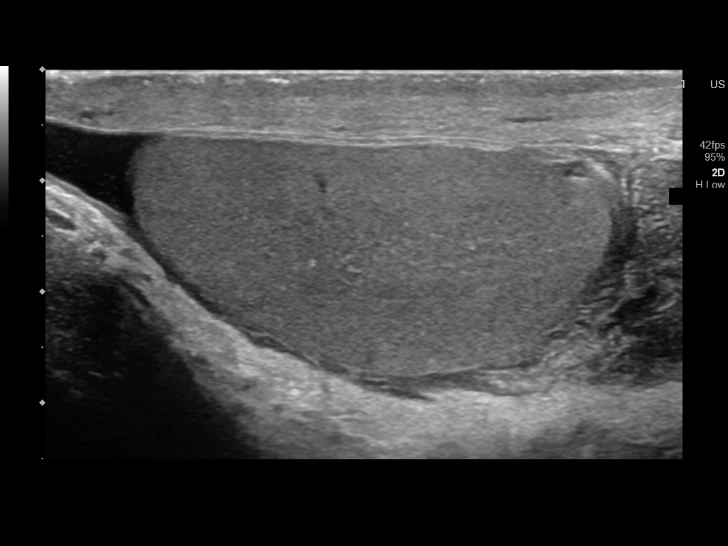
[im 25/37]
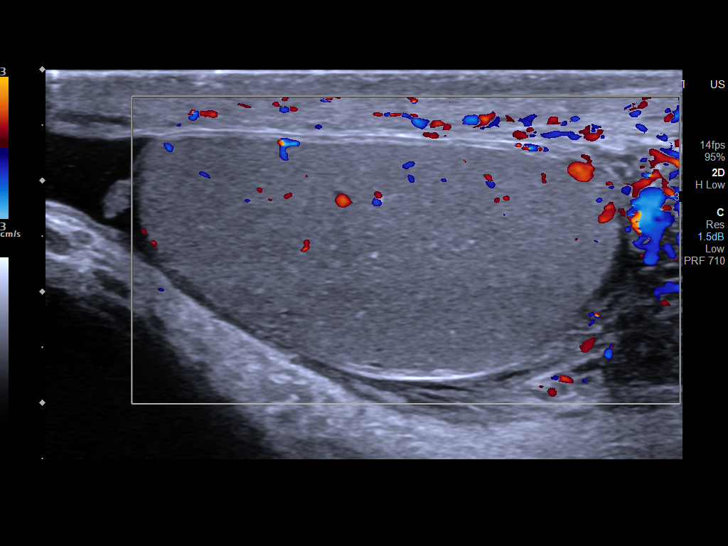
[im 28/37]
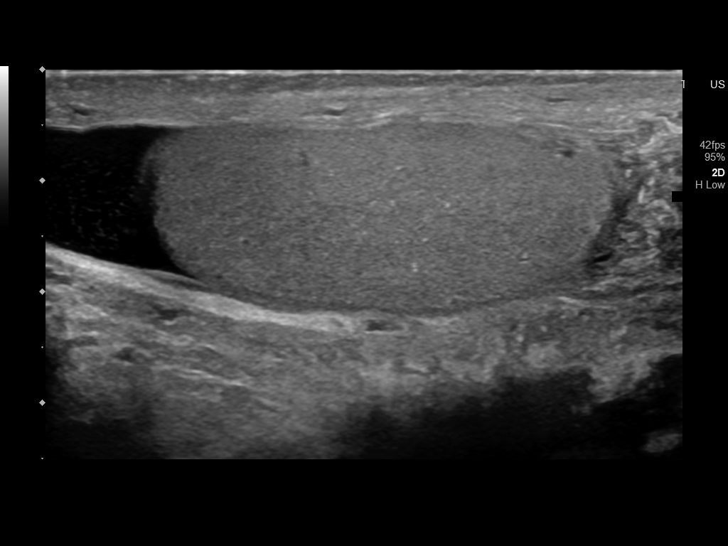
[im 31/37]
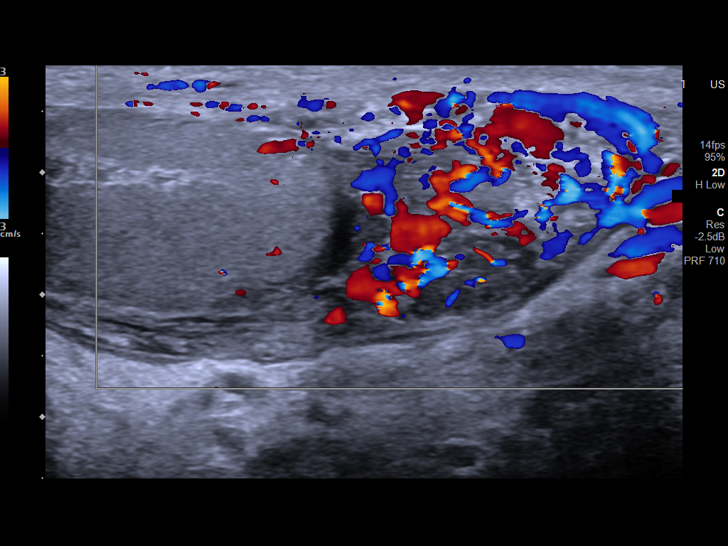
[im 34/37]
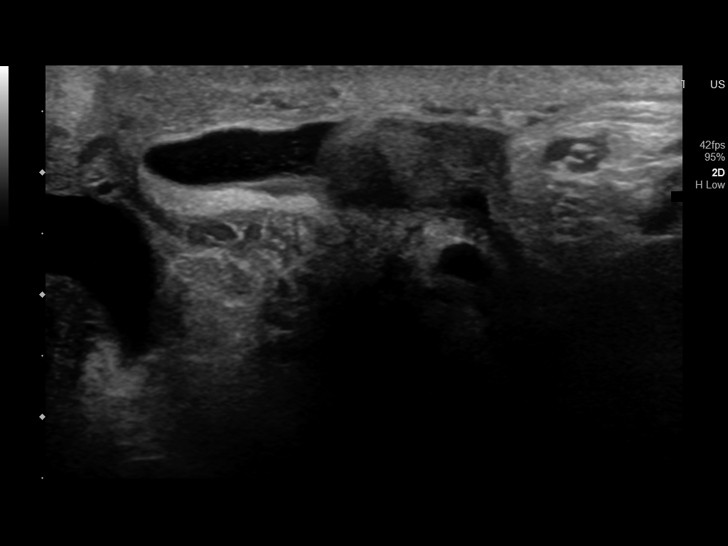
[im 37/37]
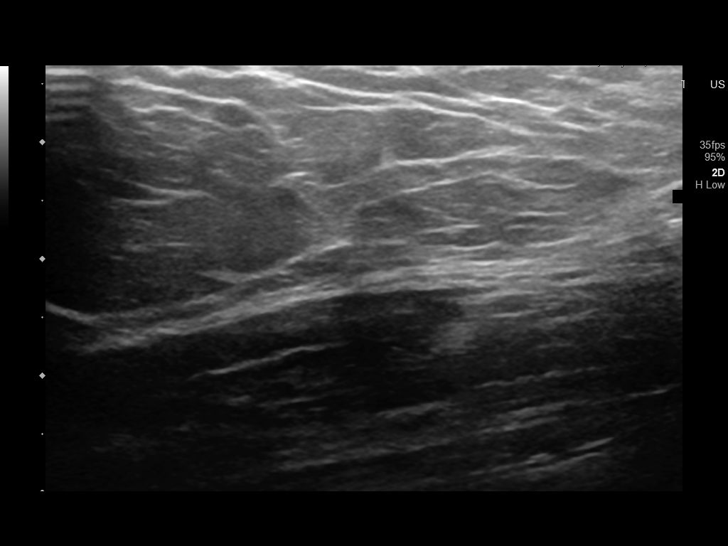

[14 of 25 positions shown; findings below may reference images not displayed]

FINDINGS: Right testicle

Measurements: 5.0 x 2.5 x 2.0 cm. No mass or microlithiasis
visualized.

Left testicle

Measurements: 4.3 x 2.1 x 3.0 cm. No mass or microlithiasis
visualized.

Right epididymis:  Normal in size and appearance.

Left epididymis: Left epididymal tail is enlarged and hypervascular.

Hydrocele:  Small left hydrocele.

Varicocele:  None visualized.

Pulsed Doppler interrogation of both testes demonstrates normal low
resistance arterial and venous waveforms bilaterally.
IMPRESSION: 1. Left epididymal tail is enlarged and hypervascular. Small left
hydrocele. Findings are suggestive for left epididymitis.
2. Normal appearance of both testicles without a testicular lesion
and no evidence for torsion.

## 2022-08-09 ENCOUNTER — Emergency Department: Payer: BC Managed Care – PPO

## 2022-08-09 ENCOUNTER — Emergency Department
Admission: EM | Admit: 2022-08-09 | Discharge: 2022-08-09 | Disposition: A | Discharge status: Home / Self Care | Payer: BC Managed Care – PPO | Attending: Emergency Medicine | Admitting: Emergency Medicine

## 2022-08-09 ENCOUNTER — Other Ambulatory Visit: Payer: Self-pay

## 2022-08-09 DIAGNOSIS — K429 Umbilical hernia without obstruction or gangrene: Secondary | ICD-10-CM | POA: Diagnosis not present

## 2022-08-09 DIAGNOSIS — I10 Essential (primary) hypertension: Secondary | ICD-10-CM | POA: Diagnosis not present

## 2022-08-09 DIAGNOSIS — R197 Diarrhea, unspecified: Secondary | ICD-10-CM | POA: Diagnosis not present

## 2022-08-09 DIAGNOSIS — K3189 Other diseases of stomach and duodenum: Secondary | ICD-10-CM | POA: Diagnosis not present

## 2022-08-09 DIAGNOSIS — R1033 Periumbilical pain: Secondary | ICD-10-CM | POA: Insufficient documentation

## 2022-08-09 DIAGNOSIS — R1030 Lower abdominal pain, unspecified: Secondary | ICD-10-CM | POA: Insufficient documentation

## 2022-08-09 LAB — URINALYSIS, ROUTINE W REFLEX MICROSCOPIC
Bacteria, UA: NONE SEEN
Bilirubin Urine: NEGATIVE
Glucose, UA: NEGATIVE mg/dL
Ketones, ur: NEGATIVE mg/dL
Leukocytes,Ua: NEGATIVE
Nitrite: NEGATIVE
Protein, ur: >=300 mg/dL — AB
Specific Gravity, Urine: >1.046 — ABNORMAL HIGH (ref 1.005–1.030)
Squamous Epithelial / HPF: NONE SEEN /HPF (ref 0–5)
pH: 6 (ref 5.0–8.0)

## 2022-08-09 LAB — COMPREHENSIVE METABOLIC PANEL
ALT: 23 U/L (ref 0–44)
AST: 32 U/L (ref 15–41)
Albumin: 3.8 g/dL (ref 3.5–5.0)
Alkaline Phosphatase: 58 U/L (ref 38–126)
Anion gap: 6 (ref 5–15)
BUN: 23 mg/dL — ABNORMAL HIGH (ref 6–20)
CO2: 26 mmol/L (ref 22–32)
Calcium: 8.8 mg/dL — ABNORMAL LOW (ref 8.9–10.3)
Chloride: 106 mmol/L (ref 98–111)
Creatinine, Ser: 1.68 mg/dL — ABNORMAL HIGH (ref 0.61–1.24)
GFR, Estimated: 47 mL/min — ABNORMAL LOW (ref >60)
Glucose, Bld: 126 mg/dL — ABNORMAL HIGH (ref 70–99)
Potassium: 3.8 mmol/L (ref 3.5–5.1)
Sodium: 138 mmol/L (ref 135–145)
Total Bilirubin: 0.7 mg/dL (ref 0.3–1.2)
Total Protein: 7.7 g/dL (ref 6.5–8.1)

## 2022-08-09 LAB — CBC
HCT: 45.3 % (ref 39.0–52.0)
Hemoglobin: 13.9 g/dL (ref 13.0–17.0)
MCH: 28.3 pg (ref 26.0–34.0)
MCHC: 30.7 g/dL (ref 30.0–36.0)
MCV: 92.1 fL (ref 80.0–100.0)
Platelets: 234 10*3/uL (ref 150–400)
RBC: 4.92 MIL/uL (ref 4.22–5.81)
RDW: 13 % (ref 11.5–15.5)
WBC: 11.6 10*3/uL — ABNORMAL HIGH (ref 4.0–10.5)
nRBC: 0 % (ref 0.0–0.2)

## 2022-08-09 LAB — LIPASE, BLOOD: Lipase: 36 U/L (ref 11–51)

## 2022-08-09 MED ORDER — IOHEXOL 300 MG/ML  SOLN
100.0000 mL | Freq: Once | INTRAMUSCULAR | Status: AC | PRN
  Administered 2022-08-09: 100 mL via INTRAVENOUS

## 2022-08-09 MED ORDER — LACTATED RINGERS IV BOLUS
2000.0000 mL | Freq: Once | INTRAVENOUS | Status: AC
  Administered 2022-08-09: 2000 mL via INTRAVENOUS

## 2022-08-09 MED ORDER — ONDANSETRON 4 MG PO TBDP: 4.0000 mg | ORAL_TABLET | Freq: Three times a day (TID) | ORAL | 0 refills | Status: DC | PRN

## 2022-08-09 MED ORDER — ONDANSETRON HCL 4 MG/2ML IJ SOLN
4.0000 mg | Freq: Once | INTRAMUSCULAR | Status: AC
  Administered 2022-08-09: 4 mg via INTRAVENOUS
  Filled 2022-08-09: qty 2

## 2022-08-09 NOTE — ED Notes (Signed)
Pt given saltines and ginger ale for PO chall. Pt assisted to toilet to give urine sample. Wife at bedside

## 2022-08-09 NOTE — ED Triage Notes (Signed)
Pt to ED via POV c/o abd pain, nausea, and diarrhea. Pain in lower abdomen started yesterday morning. Pt also endorses nausea and multiple episodes of diarrhea that started yesterday as well. Pt in NAD at this time. Denies CP, SOB

## 2022-08-09 NOTE — ED Provider Notes (Signed)
[facility] Provider Note    Event Date/Time   First MD Initiated Contact with Patient 08/09/22 0422     (approximate)   History   Abdominal Pain   HPI  Scott Holloway is a 57 y.o. male who presents to the ED for evaluation of Abdominal Pain   I review a PCP visit from 10/9.  History of HTN, HLD.  Self-reports no surgical history to the abdomen.  He presents with his wife for evaluation of about 24 hours of abdominal cramping and copious diarrhea.  He reports 30-50 episodes of watery stool in the past 1 day.  No emesis, fever or urinary changes.  No hematochezia or melena.  Reports he was feeling presyncopal on the toilet after another episode of watery stool this morning, feeling better after laying down in the cold tile.  Due to this episode and continued diarrhea, he presents to the ED for evaluation.  Reports dry mouth and requesting water or ice chips  They report that wife had a similar illness, though less severe intensity, last week and their grandchild also had a similar syndrome earlier this week  Physical Exam   Triage Vital Signs: ED Triage Vitals  Enc Vitals Group     BP 08/09/22 0417 113/67     Pulse Rate 08/09/22 0417 85     Resp 08/09/22 0417 18     Temp 08/09/22 0417 97.7 F (36.5 C)     Temp Source 08/09/22 0417 Oral     SpO2 08/09/22 0417 98 %     Weight 08/09/22 0420 211 lb (95.7 kg)     Height 08/09/22 0420 6' (1.829 m)     Head Circumference --      Peak Flow --      Pain Score 08/09/22 0419 5     Pain Loc --      Pain Edu? --      Excl. in [place]? --     Most recent vital signs: Vitals:   08/09/22 0417 08/09/22 0635  BP: 113/67 122/71  Pulse: 85 64  Resp: 18 16  Temp: 97.7 F (36.5 C)   SpO2: 98% 94%    General: Awake, no distress.  Dry mucous membranes are present, but he look systemically well and is fluent and conversational CV:  Good peripheral perfusion.  Resp:  Normal effort.  Abd:  No distention.  Fairly  generalized and mild periumbilical and lower abdominal tenderness.  Upper abdomen is benign. MSK:  No deformity noted.  Neuro:  No focal deficits appreciated. Other:     ED Results / Procedures / Treatments   Labs (all labs ordered are listed, but only abnormal results are displayed) Labs Reviewed  COMPREHENSIVE METABOLIC PANEL - Abnormal; Notable for the following components:      Result Value   Glucose, Bld 126 (*)    BUN 23 (*)    Creatinine, Ser 1.68 (*)    Calcium 8.8 (*)    GFR, Estimated 47 (*)    All other components within normal limits  CBC - Abnormal; Notable for the following components:   WBC 11.6 (*)    All other components within normal limits  LIPASE, BLOOD  URINALYSIS, ROUTINE W REFLEX MICROSCOPIC    EKG   RADIOLOGY CT abdomen/pelvis interpreted by me without evidence of acute intra-abdominal pathology  Official radiology report(s): CT ABDOMEN PELVIS W CONTRAST  Result Date: 08/09/2022 CLINICAL DATA:  57 year old male with history of lower abdominal  pain. Copious diarrhea. Tenderness to palpation. EXAM: CT ABDOMEN AND PELVIS WITH CONTRAST TECHNIQUE: Multidetector CT imaging of the abdomen and pelvis was performed using the standard protocol following bolus administration of intravenous contrast. RADIATION DOSE REDUCTION: This exam was performed according to the departmental dose-optimization program which includes automated exposure control, adjustment of the mA and/or kV according to patient size and/or use of iterative reconstruction technique. CONTRAST:  159m OMNIPAQUE IOHEXOL 300 MG/ML  SOLN COMPARISON:  CT of the abdomen and pelvis 07/24/2021. FINDINGS: Lower chest: Unremarkable. Hepatobiliary: No suspicious cystic or solid hepatic lesions. No intra or extrahepatic biliary ductal dilatation. Gallbladder is unremarkable in appearance. Pancreas: No pancreatic mass. No pancreatic ductal dilatation. No pancreatic or peripancreatic fluid collections or inflammatory  changes. Spleen: Unremarkable. Adrenals/Urinary Tract: Numerous well-defined low-attenuation nonenhancing lesions in both kidneys, compatible with simple cysts, largest of which measures up to 2.9 cm in the lateral aspect of the interpolar region of the left kidney. Other subcentimeter low-attenuation lesions in both kidneys are too small to definitively characterize, but also statistically likely to represent small cysts (no imaging follow-up for any of these lesions is recommended). No hydroureteronephrosis. Urinary bladder is nearly completely decompressed, but otherwise unremarkable in appearance. Bilateral adrenal glands are normal in appearance. Stomach/Bowel: The stomach is moderately distended, but otherwise unremarkable in appearance. No pathologic dilatation of small bowel or colon. Normal appendix. Vascular/Lymphatic: Atherosclerotic calcifications in the pelvic vasculature, without definite aneurysm or dissection in the abdominal or pelvic vasculature. No lymphadenopathy noted in the abdomen or pelvis. Reproductive: Prostate gland and seminal vesicles are unremarkable in appearance. Other: Mild laxity of the anterior abdominal wall, most pronounced in the region of the umbilicus where there is a small umbilical hernia containing only omental fat. No significant volume of ascites. No pneumoperitoneum. Musculoskeletal: There are no aggressive appearing lytic or blastic lesions noted in the visualized portions of the skeleton. IMPRESSION: 1. No acute findings to account for the patient's symptoms. 2. Small umbilical hernia containing only omental fat. No associated bowel incarceration or obstruction at this time. 3. Mild atherosclerosis. 4. Additional incidental findings, as above. Electronically Signed   By: DVinnie LangtonM.D.   On: 08/09/2022 05:39    PROCEDURES and INTERVENTIONS:  Procedures  Medications  lactated ringers bolus 2,000 mL (2,000 mLs Intravenous New Bag/Given 08/09/22 0443)   ondansetron (ZOFRAN) injection 4 mg (4 mg Intravenous Given 08/09/22 0444)  iohexol (OMNIPAQUE) 300 MG/ML solution 100 mL (100 mLs Intravenous Contrast Given 08/09/22 0517)     IMPRESSION / MDM / ASSESSMENT AND PLAN / ED COURSE  I reviewed the triage vital signs and the nursing notes.  Differential diagnosis includes, but is not limited to, gastroenteritis, SBO, diverticulitis, appendicitis  {Patient presents with symptoms of an acute illness or injury that is potentially life-threatening.  57year old male presents with about 24 hours of copious diarrhea, possibly viral in etiology.  He looks dry, but systemically well.  Has some poorly localizing tenderness that is mild throughout the mid and lower abdomen.  No peritoneal features.  His blood work is fairly benign.  Marginal leukocytosis, but I doubt sepsis or bacterial etiology of his symptoms.  Lipase is normal.  Metabolic panel with CKD near baseline.  CT obtained and without evidence of acute derangements.  Feeling better with fluid resuscitation and Zofran.  Will finish the fluids, obtain a urine and p.o. challenge.  Anticipate he will be suitable for outpatient management.  Clinical Course as of 08/09/22 [id number] Fri Aug 09, 2022  [id number] Reassessed.  Patient reports feeling better.  We discussed reassuring CT [DS]    Clinical Course User Index [DS] Vladimir Crofts, MD     FINAL CLINICAL IMPRESSION(S) / ED DIAGNOSES   Final diagnoses:  Diarrhea, unspecified type     Rx / DC Orders   ED Discharge Orders          Ordered    ondansetron (ZOFRAN-ODT) 4 MG disintegrating tablet  Every 8 hours PRN        08/09/22 0636             Note:  This document was prepared using Dragon voice recognition software and may include unintentional dictation errors.   Vladimir Crofts, MD 08/09/22 [phone]

## 2022-08-21 ENCOUNTER — Other Ambulatory Visit: Payer: Self-pay | Admitting: Physician Assistant

## 2022-08-21 ENCOUNTER — Telehealth: Payer: Self-pay | Admitting: Physician Assistant

## 2022-08-21 DIAGNOSIS — F418 Other specified anxiety disorders: Secondary | ICD-10-CM

## 2022-08-21 MED ORDER — ALPRAZOLAM 0.5 MG PO TABS: ORAL_TABLET | ORAL | 0 refills | Status: DC

## 2022-08-21 NOTE — Telephone Encounter (Signed)
Patient called in because he is leaving for [place] on Monday 08/26/22. Patient says this is his first time going on a plane and he is nervous. Patient wants to know if his doctor can prescribe him something to help him feel more relaxed on the pain. Please follow up with patient as soon as possible.

## 2022-08-22 NOTE — Telephone Encounter (Signed)
Notified pt Rx Xanax sent to [place].

## 2022-09-17 ENCOUNTER — Ambulatory Visit: Payer: Self-pay

## 2022-09-17 NOTE — Telephone Encounter (Signed)
  Chief Complaint: Itchy patches on skin Symptoms: above Frequency: Since December Pertinent Negatives: Patient denies fever Disposition: [] ED /[] Urgent Care (no appt availability in office) / [x] Appointment(In office/virtual)/ []  [place] [facility]/ [] Home Care/ [] Refused Recommended Disposition /[] [place] [facility]/ []  Follow-up with PCP Additional Notes: Pt states that in December she had some itchy red patches on his stomach and back. PT used alcohol and vinegar and the patches went away. In January again he had patches, but now on his arms. Again he used alcohol and vinegar and they went away. Today a co-worker noticed the same patches on pt's neck. Pt thinks they are on his back a well. PT states that when he scratches the spots there is pus under them.    Reason for Disposition  Looks infected (spreading redness, red streak, pus)  Answer Assessment - Initial Assessment Questions 1. DESCRIPTION: "Describe the itching you are having." "Where is it located?"     Red raised  - became scaly after scratching 2. SEVERITY: "How bad is it?"    - MILD: Doesn't interfere with normal activities.   - MODERATE-SEVERE: Interferes with work, school, sleep, or other activities.      7/10 3. SCRATCHING: "Are there any scratch marks? Bleeding?"     White stuff come out when scratching 4. ONSET: "When did the itching begin?"      December started on stomach and back, Then went to arms, now on his neck 5. CAUSE: "What do you think is causing the itching?"      unsure 6. OTHER SYMPTOMS: "Do you have any other symptoms?"      no  Protocols used: Itching - Localized-A-AH

## 2022-09-18 ENCOUNTER — Ambulatory Visit: Payer: BC Managed Care – PPO | Admitting: Physician Assistant

## 2022-09-18 NOTE — Progress Notes (Unsigned)
I,Sha'taria Tyson,acting as a Education administrator for [employer], PA-C.,have documented all relevant documentation on the behalf of Mikey Kirschner, PA-C,as directed by  Mikey Kirschner, PA-C while in the presence of Mikey Kirschner, PA-C.   Established patient visit   Patient: Scott Holloway   DOB: September 30, 1965   57 y.o. Male  MRN: [id number] Visit Date: 09/19/2022  Today's healthcare provider: Mikey Kirschner, PA-C   Cc. rash  Subjective    HPI  Pt reports a recurrent skin lesion/rash. He reports it broke out again x 2 days ago after mowing the lawn/cutting weeds. He used hydrocortisone and benadryl and it improved. He reports this has happened twice before, once when he was mowing again and another time without being outside. Reports the rash is elevated, red, itchy. Sometimes a white/clear/yellow discharge.    Medications: Outpatient Medications Prior to Visit  Medication Sig   atorvastatin (LIPITOR) 80 MG tablet Take 1 tablet (80 mg total) by mouth daily.   CALCIUM PO Take by mouth.   Multiple Vitamin (MULTIVITAMIN ADULT PO) Take by mouth.   Omega-3 Fatty Acids (FISH OIL) 1000 MG CAPS Take by mouth.   [DISCONTINUED] levothyroxine (EUTHYROX) 150 MCG tablet Take 1 tablet (150 mcg total) by mouth daily.   [DISCONTINUED] lisinopril (ZESTRIL) 40 MG tablet Take 1 tablet (40 mg total) by mouth daily.   [DISCONTINUED] ALPRAZolam (XANAX) 0.5 MG tablet Take one tablet before plane ride (Patient not taking: Reported on 09/19/2022)   [DISCONTINUED] ondansetron (ZOFRAN-ODT) 4 MG disintegrating tablet Take 1 tablet (4 mg total) by mouth every 8 (eight) hours as needed. (Patient not taking: Reported on 09/19/2022)   No facility-administered medications prior to visit.    Review of Systems  Constitutional:  Negative for fatigue and fever.  Respiratory:  Negative for cough and shortness of breath.   Cardiovascular:  Negative for chest pain, palpitations and leg swelling.  Skin:  Positive for rash.   Neurological:  Negative for dizziness and headaches.      Objective    BP 130/73 (BP Location: Right Arm, Patient Position: Sitting, Cuff Size: Normal)   Pulse 69   Ht 6' (1.829 m)   Wt 213 lb 12.8 oz (97 kg)   SpO2 100%   BMI 29.00 kg/m  BP Readings from Last 3 Encounters:  09/19/22 130/73  08/09/22 122/71  04/08/22 122/81      Physical Exam Vitals reviewed.  Constitutional:      Appearance: He is not ill-appearing.  HENT:     Head: Normocephalic.  Eyes:     Conjunctiva/sclera: Conjunctivae normal.  Cardiovascular:     Rate and Rhythm: Normal rate.  Pulmonary:     Effort: Pulmonary effort is normal. No respiratory distress.  Skin:    Comments: Pt showed a picture of the rash on his phone, appeared to be a large hive on the back of his neck  Neurological:     General: No focal deficit present.     Mental Status: He is alert and oriented to person, place, and time.  Psychiatric:        Mood and Affect: Mood normal.        Behavior: Behavior normal.     No results found for any visits on 09/19/22.  Assessment & Plan     Atopic derm / urticaria Advised pt to take claritin before mowing lawns/going outside.  Rx triamcinalone to use topically if appears again Advised it the rash appears without an outdoor trigger, would sent  to allergy for testing  Return if symptoms worsen or fail to improve.      I, Mikey Kirschner, PA-C have reviewed all documentation for this visit. The documentation on  09/19/22  for the exam, diagnosis, procedures, and orders are all accurate and complete.  Mikey Kirschner, PA-C [facility] [address] [place], [place], [address] Office: [phone] Fax: [place]

## 2022-09-19 ENCOUNTER — Ambulatory Visit (INDEPENDENT_AMBULATORY_CARE_PROVIDER_SITE_OTHER): Payer: BC Managed Care – PPO | Admitting: Physician Assistant

## 2022-09-19 ENCOUNTER — Encounter: Payer: Self-pay | Admitting: Physician Assistant

## 2022-09-19 VITALS — BP 130/73 | HR 69 | Ht 72.0 in | Wt 213.8 lb

## 2022-09-19 DIAGNOSIS — E890 Postprocedural hypothyroidism: Secondary | ICD-10-CM | POA: Diagnosis not present

## 2022-09-19 DIAGNOSIS — L209 Atopic dermatitis, unspecified: Secondary | ICD-10-CM | POA: Diagnosis not present

## 2022-09-19 DIAGNOSIS — I10 Essential (primary) hypertension: Secondary | ICD-10-CM | POA: Diagnosis not present

## 2022-09-19 MED ORDER — LISINOPRIL 40 MG PO TABS: 40.0000 mg | ORAL_TABLET | Freq: Every day | ORAL | 3 refills | Status: DC

## 2022-09-19 MED ORDER — LORATADINE 10 MG PO TABS: 10.0000 mg | ORAL_TABLET | Freq: Every day | ORAL | 1 refills | Status: DC

## 2022-09-19 MED ORDER — LEVOTHYROXINE SODIUM 150 MCG PO TABS: 150.0000 ug | ORAL_TABLET | Freq: Every day | ORAL | 3 refills | Status: DC

## 2022-09-19 MED ORDER — TRIAMCINOLONE ACETONIDE 0.1 % EX CREA: 1.0000 | TOPICAL_CREAM | Freq: Two times a day (BID) | CUTANEOUS | 1 refills | Status: AC

## 2022-10-07 ENCOUNTER — Encounter: Payer: Self-pay | Admitting: Physician Assistant

## 2022-10-07 ENCOUNTER — Ambulatory Visit (INDEPENDENT_AMBULATORY_CARE_PROVIDER_SITE_OTHER): Payer: BC Managed Care – PPO | Admitting: Physician Assistant

## 2022-10-07 VITALS — BP 131/82 | HR 60 | Ht 72.0 in | Wt 217.5 lb

## 2022-10-07 DIAGNOSIS — N1831 Chronic kidney disease, stage 3a: Secondary | ICD-10-CM | POA: Diagnosis not present

## 2022-10-07 DIAGNOSIS — R739 Hyperglycemia, unspecified: Secondary | ICD-10-CM | POA: Diagnosis not present

## 2022-10-07 DIAGNOSIS — E890 Postprocedural hypothyroidism: Secondary | ICD-10-CM | POA: Diagnosis not present

## 2022-10-07 DIAGNOSIS — I10 Essential (primary) hypertension: Secondary | ICD-10-CM | POA: Diagnosis not present

## 2022-10-07 DIAGNOSIS — E782 Mixed hyperlipidemia: Secondary | ICD-10-CM

## 2022-10-07 DIAGNOSIS — Z0000 Encounter for general adult medical examination without abnormal findings: Secondary | ICD-10-CM | POA: Diagnosis not present

## 2022-10-07 NOTE — Progress Notes (Signed)
I,Scott Holloway,acting as a Scott Holloway for [employer], PA-C.,have documented all relevant documentation on the behalf of Scott Ferguson, PA-C,as directed by  Scott Ferguson, PA-C while in the presence of Scott Ferguson, PA-C.   Complete physical exam   Patient: Scott Holloway   DOB: 1965/09/18   57 y.o. Male  MRN: [id number] Visit Date: 10/07/2022  Today's healthcare provider: Alfredia Ferguson, PA-C   Cc. Cpe   Subjective    Scott Holloway is a 57 y.o. male who presents today for a complete physical exam.  He reports consuming a general diet.  The patient reports walking daily for 30 minutes.  He generally feels well. He reports sleeping well. He does not have additional problems to discuss today.  HPI    Past Medical History:  Diagnosis Date   Graves disease 11/03/2017   Hyperlipidemia 11/03/2017   Hypertension 11/03/2017   Hypothyroidism 11/03/2017   Pneumothorax 1997   Pneumothorax 07/02/1995   Past Surgical History:  Procedure Laterality Date   COLONOSCOPY WITH PROPOFOL     COLONOSCOPY WITH PROPOFOL N/A 12/17/2019   Procedure: COLONOSCOPY WITH PROPOFOL;  Surgeon: Scott Spillers, MD;  Location: [facility] ENDOSCOPY;  Service: Endoscopy;  Laterality: N/A;   Social History   Socioeconomic History   Marital status: Married    Spouse name: Not on file   Number of children: Not on file   Years of education: Not on file   Highest education level: Not on file  Occupational History   Not on file  Tobacco Use   Smoking status: Never    Passive exposure: Never   Smokeless tobacco: Never  Vaping Use   Vaping Use: Never used  Substance and Sexual Activity   Alcohol use: Never   Drug use: Never   Sexual activity: Yes    Birth control/protection: None  Other Topics Concern   Not on file  Social History Narrative   Not on file   Social Determinants of Health   Financial Resource Strain: Not on file  Food Insecurity: Not on file  Transportation Needs: Not on file   Physical Activity: Not on file  Stress: Not on file  Social Connections: Not on file  Intimate Partner Violence: Not on file   Family Status  Relation Name Status   Mother  Alive   Father  Deceased   Sister  Alive   Brother  Alive       step brother   Son  Alive   Daughter  Alive   MGM  Deceased   MGF  Deceased   PGM  Deceased   PGF  Deceased       accidental death-froze to death    Family History  Problem Relation Age of Onset   Hypertension Mother    Aneurysm Mother    Heart attack Mother    Lung cancer Father    Healthy Sister    Hypertension Brother    Healthy Son    Healthy Daughter    Hypertension Maternal Grandmother    Aneurysm Maternal Grandfather    Alzheimer's disease Paternal Grandmother    Allergies  Allergen Reactions   Poison Oak Extract Rash    Patient Care Team: Scott Ferguson, PA-C as PCP - General (Physician Assistant)   Medications: Outpatient Medications Prior to Visit  Medication Sig   atorvastatin (LIPITOR) 80 MG tablet Take 1 tablet (80 mg total) by mouth daily.   CALCIUM PO Take by mouth.   levothyroxine (EUTHYROX) 150  MCG tablet Take 1 tablet (150 mcg total) by mouth daily.   lisinopril (ZESTRIL) 40 MG tablet Take 1 tablet (40 mg total) by mouth daily.   loratadine (CLARITIN) 10 MG tablet Take 1 tablet (10 mg total) by mouth daily.   Multiple Vitamin (MULTIVITAMIN ADULT PO) Take by mouth.   Omega-3 Fatty Acids (FISH OIL) 1000 MG CAPS Take by mouth.   triamcinolone cream (KENALOG) 0.1 % Apply 1 Application topically 2 (two) times daily. (Patient taking differently: Apply 1 Application topically 2 (two) times daily. Taking as needed)   No facility-administered medications prior to visit.    Review of Systems  HENT:  Positive for sinus pressure.   Eyes:  Positive for itching.  Genitourinary:  Positive for frequency.  Musculoskeletal:  Positive for arthralgias.    Objective    BP 131/82 (BP Location: Left Arm, Patient Position:  Sitting, Cuff Size: Normal)   Pulse 60   Ht 6' (1.829 m)   Wt 217 lb 8 oz (98.7 kg)   SpO2 100%   BMI 29.50 kg/m   Physical Exam Constitutional:      General: He is awake.     Appearance: He is well-developed.  HENT:     Head: Normocephalic.     Right Ear: Tympanic membrane, ear canal and external ear normal.     Left Ear: Tympanic membrane, ear canal and external ear normal.     Nose: Nose normal. No congestion or rhinorrhea.     Mouth/Throat:     Mouth: Mucous membranes are moist.     Pharynx: No oropharyngeal exudate or posterior oropharyngeal erythema.  Eyes:     Pupils: Pupils are equal, round, and reactive to light.  Cardiovascular:     Rate and Rhythm: Normal rate and regular rhythm.     Heart sounds: Normal heart sounds.  Pulmonary:     Effort: Pulmonary effort is normal.     Breath sounds: Normal breath sounds.  Abdominal:     General: There is no distension.     Palpations: Abdomen is soft.     Tenderness: There is no abdominal tenderness. There is no guarding.  Musculoskeletal:     Cervical back: Normal range of motion.     Right lower leg: No edema.     Left lower leg: No edema.  Lymphadenopathy:     Cervical: No cervical adenopathy.  Skin:    General: Skin is warm.  Neurological:     Mental Status: He is alert and oriented to person, place, and time.  Psychiatric:        Attention and Perception: Attention normal.        Mood and Affect: Mood normal.        Speech: Speech normal.        Behavior: Behavior normal. Behavior is cooperative.     Last depression screening scores    10/07/2022    9:08 AM 09/19/2022    8:09 AM 04/08/2022   11:02 AM  PHQ 2/9 Scores  PHQ - 2 Score 0 0 0  PHQ- 9 Score 0 0 0   Last fall risk screening    10/07/2022    9:08 AM  Fall Risk   Falls in the past year? 0  Number falls in past yr: 0  Injury with Fall? 0  Risk for fall due to : No Fall Risks  Follow up Falls evaluation completed   Last Audit-C alcohol use  screening    10/07/2022  9:08 AM  Alcohol Use Disorder Test (AUDIT)  1. How often do you have a drink containing alcohol? 0  2. How many drinks containing alcohol do you have on a typical day when you are drinking? 0  3. How often do you have six or more drinks on one occasion? 0  AUDIT-C Score 0   A score of 3 or more in women, and 4 or more in men indicates increased risk for alcohol abuse, EXCEPT if all of the points are from question 1   No results found for any visits on 10/07/22.  Assessment & Plan    Routine Health Maintenance and Physical Exam  Exercise Activities and Dietary recommendations --balanced diet high in fiber and protein, low in sugars, carbs, fats. --physical activity/exercise 30 minutes 3-5 times a week      Immunization History  Administered Date(s) Administered   PFIZER(Purple Top)SARS-COV-2 Vaccination 09/20/2019, 10/13/2019   Tdap 11/18/2019    Health Maintenance  Topic Date Due   Zoster Vaccines- Shingrix (1 of 2) Never done   COVID-19 Vaccine (3 - 2023-24 season) 03/01/2022   INFLUENZA VACCINE  01/30/2023   DTaP/Tdap/Td (2 - Td or Tdap) 11/17/2029   COLONOSCOPY (Pts 45-326yrs Insurance coverage will need to be confirmed)  12/16/2029   Hepatitis C Screening  Completed   HIV Screening  Completed   HPV VACCINES  Aged Out    Discussed health benefits of physical activity, and encouraged him to engage in regular exercise appropriate for his age and condition.  Problem List Items Addressed This Visit       Cardiovascular and Mediastinum   Essential hypertension    Chronic and well controlled Managed with lisinopril 40 mg  F/u 6 mo       Relevant Orders   Comprehensive Metabolic Panel (CMET)   CBC w/Diff/Platelet     Endocrine   Postablative hypothyroidism    Stable and well controlled        Genitourinary   CKD stage 3a, GFR 45-59 ml/min    Advised he needs to go back to nephro for follow up. Pt would prefer not to Advised lets  repeat gfr if not back to pt's baseline I would encourage a referral        Other   Hyperlipidemia    LDL goal < 100  Last check LDL 108.  Continue atorvastatin 80 mg        Other Visit Diagnoses     Annual physical exam    -  Primary   Hyperglycemia       Relevant Orders   Comprehensive Metabolic Panel (CMET)   CBC w/Diff/Platelet   HgB A1c       Pt declines shingles vaccines today Return in about 6 months (around 04/08/2023) for chronic conditions.     I, Scott FergusonLindsay Shaketa Serafin, PA-C have reviewed all documentation for this visit. The documentation on  10/07/22 for the exam, diagnosis, procedures, and orders are all accurate and complete.  Scott FergusonLindsay Shirl Ludington, PA-C [facility] [address] [place], [place], [id number] Office: [phone] Fax: [phone]   [facility]

## 2022-10-07 NOTE — Assessment & Plan Note (Signed)
Chronic and well controlled Managed with lisinopril 40 mg  F/u 6 mo

## 2022-10-07 NOTE — Assessment & Plan Note (Signed)
Stable and well controlled. 

## 2022-10-07 NOTE — Assessment & Plan Note (Signed)
Advised he needs to go back to nephro for follow up. Pt would prefer not to Advised lets repeat gfr if not back to pt's baseline I would encourage a referral

## 2022-10-07 NOTE — Assessment & Plan Note (Signed)
LDL goal < 100  Last check LDL 108.  Continue atorvastatin 80 mg

## 2022-10-08 LAB — COMPREHENSIVE METABOLIC PANEL
ALT: 13 IU/L (ref 0–44)
AST: 25 IU/L (ref 0–40)
Albumin/Globulin Ratio: 1.4 (ref 1.2–2.2)
Albumin: 3.9 g/dL (ref 3.8–4.9)
Alkaline Phosphatase: 69 IU/L (ref 44–121)
BUN/Creatinine Ratio: 12 (ref 9–20)
BUN: 18 mg/dL (ref 6–24)
Bilirubin Total: 0.3 mg/dL (ref 0.0–1.2)
CO2: 21 mmol/L (ref 20–29)
Calcium: 9.7 mg/dL (ref 8.7–10.2)
Chloride: 106 mmol/L (ref 96–106)
Creatinine, Ser: 1.51 mg/dL — ABNORMAL HIGH (ref 0.76–1.27)
Globulin, Total: 2.8 g/dL (ref 1.5–4.5)
Glucose: 101 mg/dL — ABNORMAL HIGH (ref 70–99)
Potassium: 4.5 mmol/L (ref 3.5–5.2)
Sodium: 144 mmol/L (ref 134–144)
Total Protein: 6.7 g/dL (ref 6.0–8.5)
eGFR: 53 mL/min/{1.73_m2} — ABNORMAL LOW (ref >59)

## 2022-10-08 LAB — CBC WITH DIFFERENTIAL/PLATELET
Basophils Absolute: 0.1 10*3/uL (ref 0.0–0.2)
Basos: 1 %
EOS (ABSOLUTE): 0.3 10*3/uL (ref 0.0–0.4)
Eos: 4 %
Hematocrit: 38.7 % (ref 37.5–51.0)
Hemoglobin: 12.7 g/dL — ABNORMAL LOW (ref 13.0–17.7)
Immature Grans (Abs): 0 10*3/uL (ref 0.0–0.1)
Immature Granulocytes: 0 %
Lymphocytes Absolute: 2.7 10*3/uL (ref 0.7–3.1)
Lymphs: 32 %
MCH: 28.7 pg (ref 26.6–33.0)
MCHC: 32.8 g/dL (ref 31.5–35.7)
MCV: 87 fL (ref 79–97)
Monocytes Absolute: 0.5 10*3/uL (ref 0.1–0.9)
Monocytes: 6 %
Neutrophils Absolute: 4.9 10*3/uL (ref 1.4–7.0)
Neutrophils: 57 %
Platelets: 192 10*3/uL (ref 150–450)
RBC: 4.43 x10E6/uL (ref 4.14–5.80)
RDW: 13.3 % (ref 11.6–15.4)
WBC: 8.5 10*3/uL (ref 3.4–10.8)

## 2022-10-08 LAB — HEMOGLOBIN A1C
Est. average glucose Bld gHb Est-mCnc: 131 mg/dL
Hgb A1c MFr Bld: 6.2 % — ABNORMAL HIGH (ref 4.8–5.6)

## 2023-02-01 DIAGNOSIS — M5431 Sciatica, right side: Secondary | ICD-10-CM | POA: Diagnosis not present

## 2023-02-01 DIAGNOSIS — S39012A Strain of muscle, fascia and tendon of lower back, initial encounter: Secondary | ICD-10-CM | POA: Diagnosis not present

## 2023-04-08 ENCOUNTER — Ambulatory Visit: Payer: BC Managed Care – PPO | Admitting: Family Medicine

## 2023-04-08 ENCOUNTER — Telehealth: Payer: Self-pay

## 2023-04-08 ENCOUNTER — Encounter: Payer: Self-pay | Admitting: Family Medicine

## 2023-04-08 ENCOUNTER — Ambulatory Visit: Payer: BC Managed Care – PPO | Admitting: Physician Assistant

## 2023-04-08 VITALS — BP 169/96 | HR 74 | Ht 72.0 in | Wt 228.7 lb

## 2023-04-08 DIAGNOSIS — R7303 Prediabetes: Secondary | ICD-10-CM

## 2023-04-08 DIAGNOSIS — I10 Essential (primary) hypertension: Secondary | ICD-10-CM | POA: Diagnosis not present

## 2023-04-08 DIAGNOSIS — E890 Postprocedural hypothyroidism: Secondary | ICD-10-CM

## 2023-04-08 DIAGNOSIS — N1831 Chronic kidney disease, stage 3a: Secondary | ICD-10-CM | POA: Diagnosis not present

## 2023-04-08 DIAGNOSIS — R972 Elevated prostate specific antigen [PSA]: Secondary | ICD-10-CM

## 2023-04-08 DIAGNOSIS — E782 Mixed hyperlipidemia: Secondary | ICD-10-CM

## 2023-04-08 NOTE — Patient Instructions (Signed)
The CDC recommends two doses of Shingrix (the new shingles vaccine) separated by 2 to 6 months for adults age 59 years and older. I recommend checking with your insurance plan regarding coverage for this vaccine.    

## 2023-04-08 NOTE — Progress Notes (Signed)
Care Guide Note  04/08/2023 Name: [name] MRN: [id number] DOB: [birth year]  Referred by: Jacky Kindle, FNP Reason for referral : Care Coordination (Outreach to schedule with Pharm d )   Scott Holloway is a 57 y.o. year old male who is a primary care patient of Jacky Kindle, FNP. Dawayne Cirri was referred to the pharmacist for assistance related to HTN and HLD.    Successful contact was made with the patient to discuss pharmacy services. Patient declines engagement at this time. Contact information was provided to the patient should they wish to reach out for assistance at a later time.  Penne Lash, RMA Care Guide [facility]  [place], [place] [address] Direct Dial: [phone] [email or web address]

## 2023-04-08 NOTE — Progress Notes (Signed)
Established patient visit   Patient: Scott Holloway   DOB: 1965-09-04   57 y.o. Male  MRN: [id number] Visit Date: 04/08/2023  Today's healthcare provider: Jacky Kindle, FNP  Introduced to nurse practitioner role and practice setting.  All questions answered.  Discussed provider/patient relationship and expectations.  Subjective    HPI  The patient, with a history of Graves' disease, presents for a routine check-up. He reports a recent weight fluctuation, having successfully lost 15 pounds before regaining it due to a self-professed love of eating. He expresses a desire to manage his weight but does not seem overly concerned about it.  The patient also discusses his kidney function, which has been fluctuating according to recent lab results. He mentions a previous incident where sexual activity within 48 hours of a lab test resulted in skewed results, and he suspects that a similar situation may have occurred with the current lab test. Patient clarified that this was his creatinine; however, belief that he may have been confused as this was his PSA.   The patient's blood pressure is elevated, which he attributes to the stress of caring for his elderly mother, who is currently in the hospital. He expresses frustration and stress related to his mother's care, including her reluctance to accept help and her confusion about her current situation.  The patient also mentions a recent back injury due to lifting a heavy object, which has since resolved. He reports ongoing arthritis in his big toe, which he manages with Tylenol and a topical spray from a health food store.  Lastly, the patient mentions that he has not seen an eye doctor for his Luiz Blare' disease in a while, but he does not report any current eye symptoms. He wears glasses for nearsightedness but only when driving at night.  Medications: Outpatient Medications Prior to Visit  Medication Sig   atorvastatin (LIPITOR) 80 MG tablet Take  1 tablet (80 mg total) by mouth daily.   CALCIUM PO Take by mouth.   levothyroxine (EUTHYROX) 150 MCG tablet Take 1 tablet (150 mcg total) by mouth daily.   lisinopril (ZESTRIL) 40 MG tablet Take 1 tablet (40 mg total) by mouth daily.   loratadine (CLARITIN) 10 MG tablet Take 1 tablet (10 mg total) by mouth daily.   Multiple Vitamin (MULTIVITAMIN ADULT PO) Take by mouth.   Omega-3 Fatty Acids (FISH OIL) 1000 MG CAPS Take by mouth.   triamcinolone cream (KENALOG) 0.1 % Apply 1 Application topically 2 (two) times daily. (Patient taking differently: Apply 1 Application topically 2 (two) times daily. Taking as needed)   No facility-administered medications prior to visit.     Objective    There were no vitals taken for this visit.  Physical Exam Vitals and nursing note reviewed.  Constitutional:      Appearance: Normal appearance. He is obese.  HENT:     Head: Normocephalic and atraumatic.  Cardiovascular:     Rate and Rhythm: Normal rate and regular rhythm.     Pulses: Normal pulses.     Heart sounds: Normal heart sounds.  Pulmonary:     Effort: Pulmonary effort is normal.     Breath sounds: Normal breath sounds.  Musculoskeletal:        General: Normal range of motion.     Cervical back: Normal range of motion.  Skin:    General: Skin is warm and dry.     Capillary Refill: Capillary refill takes less than 2 seconds.  Neurological:  General: No focal deficit present.     Mental Status: He is alert and oriented to person, place, and time. Mental status is at baseline.  Psychiatric:        Mood and Affect: Mood normal.        Behavior: Behavior normal.        Thought Content: Thought content normal.        Judgment: Judgment normal.    No results found for any visits on 04/08/23.  Assessment & Plan    Chronic Kidney Disease Creatinine fluctuating between 1.3 and 2.1 since May 2019. -Continue Lisinopril for kidney protection.  Hyperlipidemia LDL elevated at 108 on high  strength Lipitor. Limited options for dose increase due to kidney function. -Refill Lipitor. -Review labs when available to assess if dose adjustment is needed.  Hypothyroidism Stable on Levothyroxine . -Continue current dose.  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 -Continue current management. -Consider uric acid testing if worsening.  Graves' Disease Stable, no current ocular symptoms. Last eye exam unknown. -Consider routine ophthalmology follow-up every 2 years.  General Health Maintenance -Eligible for shingles vaccine. Consider receiving at pharmacy or future appointment. -Consider eye exam for nearsightedness, especially for night driving.  Follow-up -Review labs when available and communicate results to patient. -Pharmacist to review medications considering kidney function.      Leilani Merl, FNP, have reviewed all documentation for this visit. The documentation on 04/08/23 for the exam, diagnosis, procedures, and orders are all accurate and complete.  Jacky Kindle, FNP  [facility] [phone] (phone) [phone] (fax)  [facility]

## 2023-04-09 ENCOUNTER — Other Ambulatory Visit: Payer: Self-pay | Admitting: Family Medicine

## 2023-04-09 LAB — COMPREHENSIVE METABOLIC PANEL
ALT: 13 [IU]/L (ref 0–44)
AST: 24 [IU]/L (ref 0–40)
Albumin: 3.8 g/dL (ref 3.8–4.9)
Alkaline Phosphatase: 66 [IU]/L (ref 44–121)
BUN/Creatinine Ratio: 13 (ref 9–20)
BUN: 21 mg/dL (ref 6–24)
Bilirubin Total: 0.2 mg/dL (ref 0.0–1.2)
CO2: 25 mmol/L (ref 20–29)
Calcium: 9.4 mg/dL (ref 8.7–10.2)
Chloride: 105 mmol/L (ref 96–106)
Creatinine, Ser: 1.59 mg/dL — ABNORMAL HIGH (ref 0.76–1.27)
Globulin, Total: 2.6 g/dL (ref 1.5–4.5)
Glucose: 82 mg/dL (ref 70–99)
Potassium: 4.4 mmol/L (ref 3.5–5.2)
Sodium: 143 mmol/L (ref 134–144)
Total Protein: 6.4 g/dL (ref 6.0–8.5)
eGFR: 50 mL/min/{1.73_m2} — ABNORMAL LOW (ref >59)

## 2023-04-09 LAB — PSA: Prostate Specific Ag, Serum: 3.5 ng/mL (ref 0.0–4.0)

## 2023-04-09 LAB — CBC WITH DIFFERENTIAL/PLATELET
Basophils Absolute: 0.1 10*3/uL (ref 0.0–0.2)
Basos: 1 %
EOS (ABSOLUTE): 0.3 10*3/uL (ref 0.0–0.4)
Eos: 3 %
Hematocrit: 39.2 % (ref 37.5–51.0)
Hemoglobin: 12.4 g/dL — ABNORMAL LOW (ref 13.0–17.7)
Immature Grans (Abs): 0 10*3/uL (ref 0.0–0.1)
Immature Granulocytes: 0 %
Lymphocytes Absolute: 3.1 10*3/uL (ref 0.7–3.1)
Lymphs: 35 %
MCH: 29.2 pg (ref 26.6–33.0)
MCHC: 31.6 g/dL (ref 31.5–35.7)
MCV: 93 fL (ref 79–97)
Monocytes Absolute: 0.6 10*3/uL (ref 0.1–0.9)
Monocytes: 7 %
Neutrophils Absolute: 4.8 10*3/uL (ref 1.4–7.0)
Neutrophils: 54 %
Platelets: 248 10*3/uL (ref 150–450)
RBC: 4.24 x10E6/uL (ref 4.14–5.80)
RDW: 13 % (ref 11.6–15.4)
WBC: 8.9 10*3/uL (ref 3.4–10.8)

## 2023-04-09 LAB — HEMOGLOBIN A1C
Est. average glucose Bld gHb Est-mCnc: 137 mg/dL
Hgb A1c MFr Bld: 6.4 % — ABNORMAL HIGH (ref 4.8–5.6)

## 2023-04-09 LAB — LIPID PANEL
Chol/HDL Ratio: 4 {ratio} (ref 0.0–5.0)
Cholesterol, Total: 189 mg/dL (ref 100–199)
HDL: 47 mg/dL (ref >39)
LDL Chol Calc (NIH): 114 mg/dL — ABNORMAL HIGH (ref 0–99)
Triglycerides: 157 mg/dL — ABNORMAL HIGH (ref 0–149)
VLDL Cholesterol Cal: 28 mg/dL (ref 5–40)

## 2023-04-09 LAB — TSH: TSH: 1.94 u[IU]/mL (ref 0.450–4.500)

## 2023-04-09 MED ORDER — AMLODIPINE BESYLATE 5 MG PO TABS: 5.0000 mg | ORAL_TABLET | Freq: Every day | ORAL | 3 refills | Status: DC

## 2023-04-09 MED ORDER — EZETIMIBE 10 MG PO TABS: 10.0000 mg | ORAL_TABLET | Freq: Every day | ORAL | 3 refills | Status: DC

## 2023-04-09 NOTE — Telephone Encounter (Signed)
Pt called and notified of medication additions he was very hesitant, but did schedule 3 month f/u appointment to follow-up on meds

## 2023-04-09 NOTE — Progress Notes (Signed)
Chronic anemia, kidney disease and elevation in LDL remains. The 10-year ASCVD risk score (Arnett DK, et al., 2019) is: 21% A1c has crept up; now borderline diabetic at 6.4%. Continue to recommend balanced, lower carb meals. Smaller meal size, adding snacks. Choosing water as drink of choice and increasing purposeful exercise. Continue to recommend consult with pharmacy to assist with medications and chronic disease burden.

## 2023-05-10 ENCOUNTER — Other Ambulatory Visit: Payer: Self-pay | Admitting: Physician Assistant

## 2023-05-10 DIAGNOSIS — E782 Mixed hyperlipidemia: Secondary | ICD-10-CM

## 2023-05-28 ENCOUNTER — Ambulatory Visit: Payer: BC Managed Care – PPO

## 2023-06-09 ENCOUNTER — Ambulatory Visit: Payer: BC Managed Care – PPO | Admitting: Family Medicine

## 2023-06-09 ENCOUNTER — Encounter: Payer: Self-pay | Admitting: Family Medicine

## 2023-06-09 VITALS — BP 128/78 | HR 78 | Ht 72.0 in | Wt 227.0 lb

## 2023-06-09 DIAGNOSIS — E785 Hyperlipidemia, unspecified: Secondary | ICD-10-CM

## 2023-06-09 DIAGNOSIS — E782 Mixed hyperlipidemia: Secondary | ICD-10-CM

## 2023-06-09 DIAGNOSIS — E1169 Type 2 diabetes mellitus with other specified complication: Secondary | ICD-10-CM | POA: Diagnosis not present

## 2023-06-09 DIAGNOSIS — E1122 Type 2 diabetes mellitus with diabetic chronic kidney disease: Secondary | ICD-10-CM | POA: Insufficient documentation

## 2023-06-09 DIAGNOSIS — I10 Essential (primary) hypertension: Secondary | ICD-10-CM

## 2023-06-09 DIAGNOSIS — E1159 Type 2 diabetes mellitus with other circulatory complications: Secondary | ICD-10-CM | POA: Insufficient documentation

## 2023-06-09 DIAGNOSIS — N1831 Chronic kidney disease, stage 3a: Secondary | ICD-10-CM | POA: Diagnosis not present

## 2023-06-09 DIAGNOSIS — E118 Type 2 diabetes mellitus with unspecified complications: Secondary | ICD-10-CM | POA: Insufficient documentation

## 2023-06-09 DIAGNOSIS — I152 Hypertension secondary to endocrine disorders: Secondary | ICD-10-CM

## 2023-06-09 MED ORDER — EZETIMIBE 10 MG PO TABS: 10.0000 mg | ORAL_TABLET | Freq: Every day | ORAL | Status: DC

## 2023-06-09 MED ORDER — ATORVASTATIN CALCIUM 80 MG PO TABS: 80.0000 mg | ORAL_TABLET | Freq: Every day | ORAL | Status: DC

## 2023-06-09 MED ORDER — FISH OIL 1000 MG PO CAPS: 4000.0000 mg | ORAL_CAPSULE | Freq: Every day | ORAL | Status: AC

## 2023-06-09 MED ORDER — AMLODIPINE BESYLATE 5 MG PO TABS: 2.5000 mg | ORAL_TABLET | Freq: Every day | ORAL | 3 refills | Status: DC

## 2023-06-09 MED ORDER — LISINOPRIL 40 MG PO TABS: 40.0000 mg | ORAL_TABLET | Freq: Every day | ORAL | Status: DC

## 2023-06-09 NOTE — Progress Notes (Signed)
Established patient visit   Patient: Scott Holloway   DOB: 01/04/1966   57 y.o. Male  MRN: [id number] Visit Date: 06/09/2023  Today's healthcare provider: Jacky Kindle, FNP  Re Introduced to nurse practitioner role and practice setting.  All questions answered.  Discussed provider/patient relationship and expectations.  Chief Complaint  Patient presents with   Follow-up   Subjective    HPI HPI   Pt stated--forgotten, have not been taking the meds prescribe last OV. Last edited by Shelly Bombard, CMA on 06/09/2023 10:33 AM.      Hypertension, follow-up  BP Readings from Last 3 Encounters:  06/09/23 128/78  04/08/23 (!) 169/96  10/07/22 131/82   Wt Readings from Last 3 Encounters:  06/09/23 227 lb (103 kg)  04/08/23 228 lb 11.2 oz (103.7 kg)  10/07/22 217 lb 8 oz (98.7 kg)     He was last seen for hypertension 2 months ago.  BP at that visit was 169/96 . Management since that visit includes addition of norvasc.  He reports poor compliance with treatment. He is not having side effects.  He is following a Regular diet. He is not exercising. He does not smoke.  Use of agents associated with hypertension: none.   Outside blood pressures are "stable". Symptoms: No chest pain No chest pressure  No palpitations No syncope  No dyspnea No orthopnea  No paroxysmal nocturnal dyspnea No lower extremity edema   Pertinent labs Lab Results  Component Value Date   CHOL 189 04/08/2023   HDL 47 04/08/2023   LDLCALC 114 (H) 04/08/2023   TRIG 157 (H) 04/08/2023   CHOLHDL 4.0 04/08/2023   Lab Results  Component Value Date   NA 143 04/08/2023   K 4.4 04/08/2023   CREATININE 1.59 (H) 04/08/2023   EGFR 50 (L) 04/08/2023   GLUCOSE 82 04/08/2023   TSH 1.940 04/08/2023     The 10-year ASCVD risk score (Arnett DK, et al., 2019) is: 23.2%  ---------------------------------------------------------------------------------------------------   Medications: Outpatient  Medications Prior to Visit  Medication Sig   CALCIUM PO Take by mouth.   levothyroxine (EUTHYROX) 150 MCG tablet Take 1 tablet (150 mcg total) by mouth daily.   loratadine (CLARITIN) 10 MG tablet Take 1 tablet (10 mg total) by mouth daily.   Multiple Vitamin (MULTIVITAMIN ADULT PO) Take by mouth.   [DISCONTINUED] lisinopril (ZESTRIL) 40 MG tablet Take 1 tablet (40 mg total) by mouth daily.   [DISCONTINUED] Omega-3 Fatty Acids (FISH OIL) 1000 MG CAPS Take by mouth.   triamcinolone cream (KENALOG) 0.1 % Apply 1 Application topically 2 (two) times daily. (Patient taking differently: Apply 1 Application topically 2 (two) times daily. Taking as needed)   [DISCONTINUED] amLODipine (NORVASC) 5 MG tablet Take 1 tablet (5 mg total) by mouth daily. (Patient not taking: Reported on 06/09/2023)   [DISCONTINUED] atorvastatin (LIPITOR) 80 MG tablet Take 1 tablet by mouth once daily (Patient not taking: Reported on 06/09/2023)   [DISCONTINUED] ezetimibe (ZETIA) 10 MG tablet Take 1 tablet (10 mg total) by mouth daily. (Patient not taking: Reported on 06/09/2023)   No facility-administered medications prior to visit.    Review of Systems Last CBC Lab Results  Component Value Date   WBC 8.9 04/08/2023   HGB 12.4 (L) 04/08/2023   HCT 39.2 04/08/2023   MCV 93 04/08/2023   MCH 29.2 04/08/2023   RDW 13.0 04/08/2023   PLT 248 04/08/2023   Last metabolic panel Lab Results  Component  Value Date   GLUCOSE 82 04/08/2023   NA 143 04/08/2023   K 4.4 04/08/2023   CL 105 04/08/2023   CO2 25 04/08/2023   BUN 21 04/08/2023   CREATININE 1.59 (H) 04/08/2023   EGFR 50 (L) 04/08/2023   CALCIUM 9.4 04/08/2023   PHOS 4.4 (H) 01/14/2019   PROT 6.4 04/08/2023   ALBUMIN 3.8 04/08/2023   LABGLOB 2.6 04/08/2023   AGRATIO 1.4 10/07/2022   BILITOT 0.2 04/08/2023   ALKPHOS 66 04/08/2023   AST 24 04/08/2023   ALT 13 04/08/2023   ANIONGAP 6 08/09/2022   Last lipids Lab Results  Component Value Date   CHOL 189  04/08/2023   HDL 47 04/08/2023   LDLCALC 114 (H) 04/08/2023   TRIG 157 (H) 04/08/2023   CHOLHDL 4.0 04/08/2023   Last hemoglobin A1c Lab Results  Component Value Date   HGBA1C 6.4 (H) 04/08/2023   Last thyroid functions Lab Results  Component Value Date   TSH 1.940 04/08/2023   T4TOTAL 6.7 12/10/2018     Objective    BP 128/78 (BP Location: Left Arm, Patient Position: Sitting, Cuff Size: Large)   Pulse 78   Ht 6' (1.829 m)   Wt 227 lb (103 kg)   SpO2 97%   BMI 30.79 kg/m   BP Readings from Last 3 Encounters:  06/09/23 128/78  04/08/23 (!) 169/96  10/07/22 131/82   Wt Readings from Last 3 Encounters:  06/09/23 227 lb (103 kg)  04/08/23 228 lb 11.2 oz (103.7 kg)  10/07/22 217 lb 8 oz (98.7 kg)   Physical Exam Vitals and nursing note reviewed.  Constitutional:      Appearance: Normal appearance. He is obese.  HENT:     Head: Normocephalic and atraumatic.  Cardiovascular:     Rate and Rhythm: Normal rate and regular rhythm.     Pulses: Normal pulses.     Heart sounds: Normal heart sounds.  Pulmonary:     Effort: Pulmonary effort is normal.     Breath sounds: Normal breath sounds.  Musculoskeletal:        General: Normal range of motion.     Cervical back: Normal range of motion.  Skin:    General: Skin is warm and dry.     Capillary Refill: Capillary refill takes less than 2 seconds.  Neurological:     General: No focal deficit present.     Mental Status: He is alert and oriented to person, place, and time. Mental status is at baseline.  Psychiatric:        Mood and Affect: Mood normal.        Behavior: Behavior normal.        Thought Content: Thought content normal.        Judgment: Judgment normal.     No results found for any visits on 06/09/23.  Assessment & Plan     Problem List Items Addressed This Visit       Cardiovascular and Mediastinum   Hypertension associated with diabetes (HCC)    Chronic, remains improved but elevated Has NOT  started norvasc 5 mg Advised given worsening CKD, climbing A1c and known HLD- a lower, stricter control is recommended Encouraged to start either 5 mg or 1/2 tablet (2.5 mg) to further assist in meeting goal <119/<79      Relevant Medications   amLODipine (NORVASC) 5 MG tablet   atorvastatin (LIPITOR) 80 MG tablet   ezetimibe (ZETIA) 10 MG tablet   lisinopril (ZESTRIL)  40 MG tablet     Endocrine   Controlled type 2 diabetes mellitus with complication, without long-term current use of insulin (HCC)    Chronic, stable Repeat A1c q 6 months Continue to recommend balanced, lower carb meals. Smaller meal size, adding snacks. Choosing water as drink of choice and increasing purposeful exercise. Discussed risk for ASCVD event and need for stricter control of HLD and HTN as well as diabetic monitoring HTN, HLD, CKD 3a      Relevant Medications   atorvastatin (LIPITOR) 80 MG tablet   lisinopril (ZESTRIL) 40 MG tablet   Hyperlipidemia associated with type 2 diabetes mellitus (HCC)    Chronic, remains elevated Has NOT started on lipitor 80 mg Defer LP at this time LDL goal remains <70 given T2DM recommend diet low in saturated fat and regular exercise - 30 min at least 5 times per week       Relevant Medications   amLODipine (NORVASC) 5 MG tablet   atorvastatin (LIPITOR) 80 MG tablet   ezetimibe (ZETIA) 10 MG tablet   lisinopril (ZESTRIL) 40 MG tablet     Genitourinary   CKD stage 3a, GFR 45-59 ml/min (HCC) - Primary    Chronic, stable Emphasis of water intake with goal of 48 oz/day- 1 with each meal and 1 cup with morning/evening meds        Other   Hyperlipidemia   Relevant Medications   amLODipine (NORVASC) 5 MG tablet   atorvastatin (LIPITOR) 80 MG tablet   ezetimibe (ZETIA) 10 MG tablet   lisinopril (ZESTRIL) 40 MG tablet   Omega-3 Fatty Acids (FISH OIL) 1000 MG CAPS   Other Visit Diagnoses     Essential hypertension       Relevant Medications   amLODipine  (NORVASC) 5 MG tablet   atorvastatin (LIPITOR) 80 MG tablet   ezetimibe (ZETIA) 10 MG tablet   lisinopril (ZESTRIL) 40 MG tablet      Return in about 4 months (around 10/08/2023) for chonic disease management.     Leilani Merl, FNP, have reviewed all documentation for this visit. The documentation on 06/09/23 for the exam, diagnosis, procedures, and orders are all accurate and complete.  Jacky Kindle, FNP  [facility] [phone] (phone) [phone] (fax)  [facility]

## 2023-06-09 NOTE — Assessment & Plan Note (Signed)
Chronic, remains elevated Has NOT started on lipitor 80 mg Defer LP at this time LDL goal remains <70 given T2DM recommend diet low in saturated fat and regular exercise - 30 min at least 5 times per week

## 2023-06-09 NOTE — Assessment & Plan Note (Deleted)
Chronic, remains improved but elevated Has NOT started norvasc 5 mg Advised given worsening CKD, climbing A1c and known HLD- a lower, stricter control is recommended Encouraged to start either 5 mg or 1/2 tablet (2.5 mg) to further assist in meeting goal <119/<79

## 2023-06-09 NOTE — Assessment & Plan Note (Signed)
Chronic, remains improved but elevated Has NOT started norvasc 5 mg Advised given worsening CKD, climbing A1c and known HLD- a lower, stricter control is recommended Encouraged to start either 5 mg or 1/2 tablet (2.5 mg) to further assist in meeting goal <119/<79

## 2023-06-09 NOTE — Assessment & Plan Note (Signed)
Chronic, stable Emphasis of water intake with goal of 48 oz/day- 1 with each meal and 1 cup with morning/evening meds

## 2023-06-09 NOTE — Assessment & Plan Note (Addendum)
Chronic, stable Repeat A1c q 6 months Continue to recommend balanced, lower carb meals. Smaller meal size, adding snacks. Choosing water as drink of choice and increasing purposeful exercise. Discussed risk for ASCVD event and need for stricter control of HLD and HTN as well as diabetic monitoring HTN, HLD, CKD 3a

## 2023-06-30 DIAGNOSIS — J019 Acute sinusitis, unspecified: Secondary | ICD-10-CM | POA: Diagnosis not present

## 2023-06-30 DIAGNOSIS — J209 Acute bronchitis, unspecified: Secondary | ICD-10-CM | POA: Diagnosis not present

## 2023-06-30 DIAGNOSIS — Z03818 Encounter for observation for suspected exposure to other biological agents ruled out: Secondary | ICD-10-CM | POA: Diagnosis not present

## 2023-06-30 DIAGNOSIS — J101 Influenza due to other identified influenza virus with other respiratory manifestations: Secondary | ICD-10-CM | POA: Diagnosis not present

## 2023-09-30 DIAGNOSIS — Z03818 Encounter for observation for suspected exposure to other biological agents ruled out: Secondary | ICD-10-CM | POA: Diagnosis not present

## 2023-09-30 DIAGNOSIS — J069 Acute upper respiratory infection, unspecified: Secondary | ICD-10-CM | POA: Diagnosis not present

## 2023-10-22 ENCOUNTER — Emergency Department

## 2023-10-22 ENCOUNTER — Encounter: Payer: Self-pay | Admitting: Emergency Medicine

## 2023-10-22 ENCOUNTER — Other Ambulatory Visit: Payer: Self-pay | Admitting: Physician Assistant

## 2023-10-22 ENCOUNTER — Other Ambulatory Visit: Payer: Self-pay

## 2023-10-22 ENCOUNTER — Emergency Department
Admission: EM | Admit: 2023-10-22 | Discharge: 2023-10-22 | Disposition: A | Discharge status: Home / Self Care | Attending: Emergency Medicine | Admitting: Emergency Medicine

## 2023-10-22 DIAGNOSIS — E890 Postprocedural hypothyroidism: Secondary | ICD-10-CM

## 2023-10-22 DIAGNOSIS — N1831 Chronic kidney disease, stage 3a: Secondary | ICD-10-CM

## 2023-10-22 DIAGNOSIS — E039 Hypothyroidism, unspecified: Secondary | ICD-10-CM | POA: Diagnosis not present

## 2023-10-22 DIAGNOSIS — R059 Cough, unspecified: Secondary | ICD-10-CM | POA: Diagnosis not present

## 2023-10-22 DIAGNOSIS — D72829 Elevated white blood cell count, unspecified: Secondary | ICD-10-CM | POA: Insufficient documentation

## 2023-10-22 DIAGNOSIS — N390 Urinary tract infection, site not specified: Secondary | ICD-10-CM | POA: Insufficient documentation

## 2023-10-22 DIAGNOSIS — R509 Fever, unspecified: Secondary | ICD-10-CM | POA: Diagnosis not present

## 2023-10-22 DIAGNOSIS — E118 Type 2 diabetes mellitus with unspecified complications: Secondary | ICD-10-CM

## 2023-10-22 DIAGNOSIS — R300 Dysuria: Secondary | ICD-10-CM | POA: Diagnosis not present

## 2023-10-22 DIAGNOSIS — E1159 Type 2 diabetes mellitus with other circulatory complications: Secondary | ICD-10-CM

## 2023-10-22 DIAGNOSIS — I10 Essential (primary) hypertension: Secondary | ICD-10-CM | POA: Insufficient documentation

## 2023-10-22 LAB — CBC WITH DIFFERENTIAL/PLATELET
Abs Immature Granulocytes: 0.15 10*3/uL — ABNORMAL HIGH (ref 0.00–0.07)
Basophils Absolute: 0.1 10*3/uL (ref 0.0–0.1)
Basophils Relative: 0 %
Eosinophils Absolute: 0 10*3/uL (ref 0.0–0.5)
Eosinophils Relative: 0 %
HCT: 39.9 % (ref 39.0–52.0)
Hemoglobin: 13 g/dL (ref 13.0–17.0)
Immature Granulocytes: 1 %
Lymphocytes Relative: 8 %
Lymphs Abs: 1.3 10*3/uL (ref 0.7–4.0)
MCH: 30.4 pg (ref 26.0–34.0)
MCHC: 32.6 g/dL (ref 30.0–36.0)
MCV: 93.4 fL (ref 80.0–100.0)
Monocytes Absolute: 0.8 10*3/uL (ref 0.1–1.0)
Monocytes Relative: 5 %
Neutro Abs: 15 10*3/uL — ABNORMAL HIGH (ref 1.7–7.7)
Neutrophils Relative %: 86 %
Platelets: 201 10*3/uL (ref 150–400)
RBC: 4.27 MIL/uL (ref 4.22–5.81)
RDW: 13.2 % (ref 11.5–15.5)
WBC: 17.4 10*3/uL — ABNORMAL HIGH (ref 4.0–10.5)
nRBC: 0 % (ref 0.0–0.2)

## 2023-10-22 LAB — URINALYSIS, W/ REFLEX TO CULTURE (INFECTION SUSPECTED)
Bacteria, UA: NONE SEEN
Bilirubin Urine: NEGATIVE
Glucose, UA: NEGATIVE mg/dL
Ketones, ur: NEGATIVE mg/dL
Nitrite: NEGATIVE
Protein, ur: >=300 mg/dL — AB
Specific Gravity, Urine: 1.014 (ref 1.005–1.030)
Squamous Epithelial / HPF: 0 /HPF (ref 0–5)
WBC, UA: >50 WBC/hpf (ref 0–5)
pH: 6 (ref 5.0–8.0)

## 2023-10-22 LAB — RESP PANEL BY RT-PCR (RSV, FLU A&B, COVID)  RVPGX2
Influenza A by PCR: NEGATIVE
Influenza B by PCR: NEGATIVE
Resp Syncytial Virus by PCR: NEGATIVE
SARS Coronavirus 2 by RT PCR: NEGATIVE

## 2023-10-22 LAB — COMPREHENSIVE METABOLIC PANEL WITH GFR
ALT: 15 U/L (ref 0–44)
AST: 22 U/L (ref 15–41)
Albumin: 3.5 g/dL (ref 3.5–5.0)
Alkaline Phosphatase: 51 U/L (ref 38–126)
Anion gap: 9 (ref 5–15)
BUN: 23 mg/dL — ABNORMAL HIGH (ref 6–20)
CO2: 24 mmol/L (ref 22–32)
Calcium: 9.2 mg/dL (ref 8.9–10.3)
Chloride: 102 mmol/L (ref 98–111)
Creatinine, Ser: 1.84 mg/dL — ABNORMAL HIGH (ref 0.61–1.24)
GFR, Estimated: 41 mL/min — ABNORMAL LOW (ref >60)
Glucose, Bld: 155 mg/dL — ABNORMAL HIGH (ref 70–99)
Potassium: 3.4 mmol/L — ABNORMAL LOW (ref 3.5–5.1)
Sodium: 135 mmol/L (ref 135–145)
Total Bilirubin: 0.9 mg/dL (ref 0.0–1.2)
Total Protein: 7.2 g/dL (ref 6.5–8.1)

## 2023-10-22 LAB — TROPONIN I (HIGH SENSITIVITY): Troponin I (High Sensitivity): 9 ng/L (ref <18)

## 2023-10-22 LAB — LACTIC ACID, PLASMA: Lactic Acid, Venous: 1.4 mmol/L (ref 0.5–1.9)

## 2023-10-22 MED ORDER — SODIUM CHLORIDE 0.9 % IV SOLN
2.0000 g | Freq: Once | INTRAVENOUS | Status: AC
  Administered 2023-10-22: 2 g via INTRAVENOUS
  Filled 2023-10-22: qty 20

## 2023-10-22 MED ORDER — MORPHINE SULFATE (PF) 4 MG/ML IV SOLN
4.0000 mg | Freq: Once | INTRAVENOUS | Status: AC
  Administered 2023-10-22: 4 mg via INTRAVENOUS
  Filled 2023-10-22: qty 1

## 2023-10-22 MED ORDER — SODIUM CHLORIDE 0.9 % IV BOLUS
500.0000 mL | Freq: Once | INTRAVENOUS | Status: AC
  Administered 2023-10-22: 500 mL via INTRAVENOUS

## 2023-10-22 MED ORDER — ONDANSETRON HCL 4 MG/2ML IJ SOLN
4.0000 mg | Freq: Once | INTRAMUSCULAR | Status: AC
  Administered 2023-10-22: 4 mg via INTRAVENOUS
  Filled 2023-10-22: qty 2

## 2023-10-22 MED ORDER — CEPHALEXIN 500 MG PO CAPS: 500.0000 mg | ORAL_CAPSULE | Freq: Three times a day (TID) | ORAL | 0 refills | Status: AC

## 2023-10-22 NOTE — Discharge Instructions (Addendum)
 Follow-up with your regular doctor.  Please call her for an appointment.  Return emergency department worsening.  Take the antibiotic as prescribed.  Tylenol  for headache, fever, chills.  Can also use ibuprofen but I would use this sparingly. The urine culture will result in 24 to 48 hours.  If it is resistant to the antibiotic that I have given you someone will call you to change this medication.

## 2023-10-22 NOTE — ED Triage Notes (Signed)
 Pt presents to the ED POV from home with wife. Pt states that he is achy and is having dysuria. Pt states that he was up every two hours using the bathroom last night. Pt reports that these symptoms started last night. Reports chills no fever that he knows of. Pt reports headaches. Pt urinated upon arrival to the ED and is not able to give a urine sample at this time.

## 2023-10-22 NOTE — ED Provider Notes (Signed)
 [facility] Provider Note    Event Date/Time   First MD Initiated Contact with Patient 10/22/23 [phone]     (approximate)   History   Dysuria and Generalized Body Aches   HPI  Scott Holloway is a 58 y.o. male history of hypertension, hypothyroidism, hyperlipidemia and prediabetes presents emergency department with fever, chills, body aches and difficulty with urination.  Patient states his mouth feels very dry.  Has been up almost every hour on the hour urinating overnight.  Wife states that he had a cough last week and it still lingers.  No vomiting or diarrhea.  No coronary artery disease that she knows of.  However his mother started with heart disease in her 30s.  Fever chills and weakness started overnight      Physical Exam   Triage Vital Signs: ED Triage Vitals  Encounter Vitals Group     BP 10/22/23 0742 (!) 167/74     Systolic BP Percentile --      Diastolic BP Percentile --      Pulse Rate 10/22/23 0742 96     Resp 10/22/23 0742 18     Temp 10/22/23 0742 99 F (37.2 C)     Temp src --      SpO2 10/22/23 0742 99 %     Weight 10/22/23 0743 233 lb (105.7 kg)     Height 10/22/23 0743 6' (1.829 m)     Head Circumference --      Peak Flow --      Pain Score 10/22/23 0742 5     Pain Loc --      Pain Education --      Exclude from Growth Chart --     Most recent vital signs: Vitals:   10/22/23 0850 10/22/23 1147  BP:  (!) 160/70  Pulse:  88  Resp:  18  Temp: 99.3 F (37.4 C) 98.4 F (36.9 C)  SpO2:  99%     General: Awake, no distress.   CV:  Good peripheral perfusion. regular rate and  rhythm Resp:  Normal effort. Lungs CTA Abd:  No distention.  Nontender Other:  Patient is currently having chills, color is a little grayish   ED Results / Procedures / Treatments   Labs (all labs ordered are listed, but only abnormal results are displayed) Labs Reviewed  URINALYSIS, W/ REFLEX TO CULTURE (INFECTION SUSPECTED) - Abnormal;  Notable for the following components:      Result Value   Color, Urine YELLOW (*)    APPearance HAZY (*)    Hgb urine dipstick LARGE (*)    Protein, ur >=300 (*)    Leukocytes,Ua MODERATE (*)    All other components within normal limits  COMPREHENSIVE METABOLIC PANEL WITH GFR - Abnormal; Notable for the following components:   Potassium 3.4 (*)    Glucose, Bld 155 (*)    BUN 23 (*)    Creatinine, Ser 1.84 (*)    GFR, Estimated 41 (*)    All other components within normal limits  CBC WITH DIFFERENTIAL/PLATELET - Abnormal; Notable for the following components:   WBC 17.4 (*)    Neutro Abs 15.0 (*)    Abs Immature Granulocytes 0.15 (*)    All other components within normal limits  RESP PANEL BY RT-PCR (RSV, FLU A&B, COVID)  RVPGX2  URINE CULTURE  LACTIC ACID, PLASMA  TROPONIN I (HIGH SENSITIVITY)     EKG  EKG   RADIOLOGY  Chest  x-ray   PROCEDURES:   Procedures Chief Complaint  Patient presents with   Dysuria   Generalized Body Aches      MEDICATIONS ORDERED IN ED: Medications  sodium chloride  0.9 % bolus 500 mL (0 mLs Intravenous Stopped 10/22/23 1006)  cefTRIAXone  (ROCEPHIN ) 2 g in sodium chloride  0.9 % 100 mL IVPB (0 g Intravenous Stopped 10/22/23 1146)  morphine  (PF) 4 MG/ML injection 4 mg (4 mg Intravenous Given 10/22/23 0957)  ondansetron  (ZOFRAN ) injection 4 mg (4 mg Intravenous Given 10/22/23 0957)     IMPRESSION / MDM / ASSESSMENT AND PLAN / ED COURSE  I reviewed the triage vital signs and the nursing notes.                              Differential diagnosis includes, but is not limited to, sepsis, COVID, influenza, RSV, CAP, DKA, UTI  Patient's presentation is most consistent with acute presentation with potential threat to life or bodily function.   Due to the patient's history of a cough for more than a week along with fever, weakness chills we will go ahead and evaluate him for sepsis.  Does have a low-grade temp of 99 here in the ED.  However  he is not tachycardic.  Labs, EKG, chest x-ray ordered   Lactic acid is normal so sepsis less likely.  Respiratory panel, troponin and lactic were all reassuring  CBC has elevated WBC of 17.4 indicating infection.  Urinalysis has moderate amount of leuks.  Greater than 50 WBCs in the urine also.  Glucose elevated at 155 and kidney functions have decreased from his last visit.  However does not appear to be in AKI and some of these findings may be secondary to dehydration.  Patient was given normal saline 500 mg IV, Rocephin  2 g IV for the UTI.  Will discharge to home with a prescription for an antibiotic.  He is to drink plenty of fluids.  Follow-up with his doctor for a A1c to see if his diabetes is worsening.  Also for recheck of his kidney functions.  Patient be discharged with Keflex  500 3 times daily.  Strict instructions to return for worsening   FINAL CLINICAL IMPRESSION(S) / ED DIAGNOSES   Final diagnoses:  Acute UTI     Rx / DC Orders   ED Discharge Orders          Ordered    cephALEXin  (KEFLEX ) 500 MG capsule  3 times daily        10/22/23 1007             Note:  This document was prepared using Dragon voice recognition software and may include unintentional dictation errors.    Delsie Figures, PA-C 10/22/23 1248    Bryson Carbine, MD 10/22/23 1255

## 2023-10-24 LAB — URINE CULTURE

## 2023-10-25 ENCOUNTER — Other Ambulatory Visit: Payer: Self-pay | Admitting: Physician Assistant

## 2023-10-25 DIAGNOSIS — E890 Postprocedural hypothyroidism: Secondary | ICD-10-CM

## 2023-10-27 ENCOUNTER — Other Ambulatory Visit: Payer: Self-pay | Admitting: Physician Assistant

## 2023-10-27 DIAGNOSIS — E890 Postprocedural hypothyroidism: Secondary | ICD-10-CM

## 2023-10-27 DIAGNOSIS — I152 Hypertension secondary to endocrine disorders: Secondary | ICD-10-CM

## 2023-10-27 DIAGNOSIS — N1831 Chronic kidney disease, stage 3a: Secondary | ICD-10-CM

## 2023-10-27 DIAGNOSIS — E118 Type 2 diabetes mellitus with unspecified complications: Secondary | ICD-10-CM

## 2023-10-31 ENCOUNTER — Encounter: Payer: Self-pay | Admitting: Family Medicine

## 2023-10-31 ENCOUNTER — Ambulatory Visit: Admitting: Family Medicine

## 2023-10-31 VITALS — BP 148/86 | HR 77 | Ht 72.0 in | Wt 233.0 lb

## 2023-10-31 DIAGNOSIS — E1159 Type 2 diabetes mellitus with other circulatory complications: Secondary | ICD-10-CM | POA: Diagnosis not present

## 2023-10-31 DIAGNOSIS — R7303 Prediabetes: Secondary | ICD-10-CM

## 2023-10-31 DIAGNOSIS — R61 Generalized hyperhidrosis: Secondary | ICD-10-CM

## 2023-10-31 DIAGNOSIS — N1831 Chronic kidney disease, stage 3a: Secondary | ICD-10-CM | POA: Diagnosis not present

## 2023-10-31 DIAGNOSIS — N3001 Acute cystitis with hematuria: Secondary | ICD-10-CM | POA: Diagnosis not present

## 2023-10-31 DIAGNOSIS — R350 Frequency of micturition: Secondary | ICD-10-CM | POA: Diagnosis not present

## 2023-10-31 DIAGNOSIS — I152 Hypertension secondary to endocrine disorders: Secondary | ICD-10-CM | POA: Diagnosis not present

## 2023-10-31 DIAGNOSIS — R809 Proteinuria, unspecified: Secondary | ICD-10-CM

## 2023-10-31 LAB — POCT URINALYSIS DIPSTICK
Bilirubin, UA: NEGATIVE
Glucose, UA: NEGATIVE
Ketones, UA: NEGATIVE
Leukocytes, UA: NEGATIVE
Nitrite, UA: NEGATIVE
Protein, UA: POSITIVE — AB
Spec Grav, UA: 1.025 (ref 1.010–1.025)
Urobilinogen, UA: 0.2 U/dL
pH, UA: 6 (ref 5.0–8.0)

## 2023-10-31 LAB — POCT GLYCOSYLATED HEMOGLOBIN (HGB A1C): Hemoglobin A1C: 6.4 % — AB (ref 4.0–5.6)

## 2023-10-31 NOTE — Patient Instructions (Addendum)
 We will follow up with results of labs once they are available.     VISIT SUMMARY: You had a follow-up appointment today after your recent urinary tract infection (UTI). We discussed your symptoms, current medications, and overall health. Your UTI symptoms have improved but are still present, and we reviewed your blood pressure, kidney function, prediabetes, and episodes of sweating.  YOUR PLAN: -ACUTE URINARY TRACT INFECTION: A urinary tract infection is an infection in any part of your urinary system. You were treated with antibiotics, and your symptoms are improving. Please complete your current course of Keflex . We will perform a urinalysis to check for any remaining infection. If symptoms persist, we may refer you to a urologist.  -HYPERTENSION: Hypertension is high blood pressure. Your blood pressure was a bit high today, but your home readings are usually better. Continue taking lisinopril  40 mg daily. We will recheck your blood pressure in a few weeks. Monitor your blood pressure at home, aiming for readings in the 120s/70s. We will hold off on adding amlodipine  unless necessary and reassess your sinus medication use.  -CHRONIC KIDNEY DISEASE, STAGE UNSPECIFIED: Chronic kidney disease means your kidneys are not working as well as they should. Your previous kidney function tests showed some impairment. We will consider rechecking your kidney function with a metabolic panel.  -PREDIABETES: Prediabetes means your blood sugar levels are higher than normal but not high enough to be classified as diabetes. Your A1c level remains at 6.4%. We will check your A1c levels today to monitor for any changes.  -GRAVES' DISEASE: Graves' disease is an immune system disorder that results in the overproduction of thyroid  hormones. You have no current symptoms, and we will continue to monitor your thyroid  function as part of your routine care.  -HYPERHIDROSIS: Hyperhidrosis is excessive sweating. You have been  experiencing sweating episodes since your UTI. We will perform a metabolic panel to assess thyroid  function and other potential causes of sweating.  INSTRUCTIONS: 1. Complete your current course of Keflex . 2. Perform a urinalysis to check for any remaining infection. 3. Monitor your blood pressure at home, aiming for readings in the 120s/70s. 4. Recheck your blood pressure in a few weeks. 5. Check your A1c levels today. 6. Perform a metabolic panel to assess kidney and thyroid  function.                      Contains text generated by [employer]bridge.                                 Contains text generated by [employer]bridge.

## 2023-10-31 NOTE — Progress Notes (Signed)
 Established patient visit   Patient: Scott Holloway   DOB: May 20, 1966   58 y.o. Male  MRN: [id number] Visit Date: 10/31/2023  Today's healthcare provider: Mimi Alt, MD   Chief Complaint  Patient presents with   Follow-up    Patient was seen at [facility] on 10/22/23 for Acute UTI. Pt was given Cephalaxin 500 mg TID for 10 days.    Urinary Tract Infection    Pt reports feeling better but not 100% and that he is still getting up every few hours vs every 30 minutes like before. Also reports no pressure when urinating.    Diabetes    Pt also reports symptoms of u   Subjective     HPI     Follow-up    Additional comments: Patient was seen at [facility] on 10/22/23 for Acute UTI. Pt was given Cephalaxin 500 mg TID for 10 days.         Urinary Tract Infection    Additional comments: Pt reports feeling better but not 100% and that he is still getting up every few hours vs every 30 minutes like before. Also reports no pressure when urinating.         Diabetes    Additional comments: Pt also reports symptoms of u      Last edited by Pasty Bongo, CMA on 10/31/2023 10:24 AM.       Discussed the use of AI scribe software for clinical note transcription with the patient, who gave verbal consent to proceed.  History of Present Illness Scott Holloway is a 58 year old male who presents for a hospital follow-up after a recent urinary tract infection.  He was evaluated in the emergency department on October 22, 2023, for an acute urinary tract infection. Symptoms included dysuria, body aches, fever, weakness, chills, and a cough. Urine studies showed a yellow appearance, hazy, with a large amount of hemoglobin, protein greater than 300, and moderate leukocytes. His complete metabolic panel revealed mildly low potassium at 3.4, elevated creatinine at 1.84, and a GFR of 41. White blood cell count was elevated at 17 with 15 absolute neutrophils. In the ED, he received treatment  with IV fluids, ceftriaxone  2 grams, morphine , and Zofran . He was discharged with a prescription for Keflex  500 mg three times daily for ten days, which is set to end tomorrow, Nov 01, 2023.  He notes improvement in urinary frequency but it remains more frequent than before the UTI, occurring every two and a half hours during the day and every two hours at night. Prior to the UTI, he could sleep through the night without urinating. He also experiences sweating episodes that began with the UTI, sometimes occurring at night, but denies any fever during these episodes. No blood in the urine and no significant changes in urination frequency. Sweating episodes occur primarily on the forehead, without associated symptoms like shakiness or feeling sick. No recent fever.  He has a history of pre-diabetes with A1c levels of 6.4 or higher over the last two years. He is concerned about the two blood pressure medications prescribed, lisinopril  40 mg and amlodipine  2.5 mg, and notes he is not currently taking the amlodipine . His blood pressure at home is usually around 135/70s.  He has a history of chronic kidney disease and previously saw a kidney specialist for about a year. A kidney biopsy was performed, but the findings did not match the initial concerns, and he declined further studies. He also  has a history of Graves' disease, which is monitored by his primary care provider.     Past Medical History:  Diagnosis Date   Graves disease 11/03/2017   Hyperlipidemia 11/03/2017   Hypertension 11/03/2017   Hypothyroidism 11/03/2017   Pneumothorax 1997   Pneumothorax 07/02/1995    Medications: Outpatient Medications Prior to Visit  Medication Sig   amLODipine  (NORVASC ) 5 MG tablet Take 0.5 tablets (2.5 mg total) by mouth daily. Increase to 5 mg as tolerated Consistent BP goal remains <119 and <79   atorvastatin  (LIPITOR) 80 MG tablet Take 1 tablet (80 mg total) by mouth daily.   CALCIUM  PO Take by mouth.    cephALEXin  (KEFLEX ) 500 MG capsule Take 1 capsule (500 mg total) by mouth 3 (three) times daily for 10 days.   ezetimibe  (ZETIA ) 10 MG tablet Take 1 tablet (10 mg total) by mouth daily.   levothyroxine  (EUTHYROX ) 150 MCG tablet Take 1 tablet (150 mcg total) by mouth daily.   lisinopril  (ZESTRIL ) 40 MG tablet Take 1 tablet (40 mg total) by mouth daily.   loratadine  (CLARITIN ) 10 MG tablet Take 1 tablet (10 mg total) by mouth daily.   Multiple Vitamin (MULTIVITAMIN ADULT PO) Take by mouth.   Omega-3 Fatty Acids (FISH OIL ) 1000 MG CAPS Take 4 capsules (4,000 mg total) by mouth daily at 12 noon.   triamcinolone  cream (KENALOG ) 0.1 % Apply 1 Application topically 2 (two) times daily. (Patient taking differently: Apply 1 Application topically 2 (two) times daily. Taking as needed)   No facility-administered medications prior to visit.    Review of Systems  Lab Results  Component Value Date   HGBA1C 6.4 (A) 10/31/2023    Last CBC Lab Results  Component Value Date   WBC 17.4 (H) 10/22/2023   HGB 13.0 10/22/2023   HCT 39.9 10/22/2023   MCV 93.4 10/22/2023   MCH 30.4 10/22/2023   RDW 13.2 10/22/2023   PLT 201 10/22/2023   Last metabolic panel Lab Results  Component Value Date   GLUCOSE 155 (H) 10/22/2023   NA 135 10/22/2023   K 3.4 (L) 10/22/2023   CL 102 10/22/2023   CO2 24 10/22/2023   BUN 23 (H) 10/22/2023   CREATININE 1.84 (H) 10/22/2023   GFRNONAA 41 (L) 10/22/2023   CALCIUM  9.2 10/22/2023   PHOS 4.4 (H) 01/14/2019   PROT 7.2 10/22/2023   ALBUMIN 3.5 10/22/2023   LABGLOB 2.6 04/08/2023   AGRATIO 1.4 10/07/2022   BILITOT 0.9 10/22/2023   ALKPHOS 51 10/22/2023   AST 22 10/22/2023   ALT 15 10/22/2023   ANIONGAP 9 10/22/2023   Results LABS Urinalysis: yellow appearance, hazy, large amount of hemoglobin, >300 mg/dL protein, moderate leukocytes (10/22/2023) CMP: K 3.4 mmol/L, Cr 1.84 mg/dL, GFR 41 VW/UJW/1.19J (47/82/9562) WBC: 17 x10^3/L (10/22/2023) Absolute  neutrophils: 15 x10^3/L (10/22/2023) A1c: 6.4% (10/31/2023)        Objective    BP (!) 148/86 (BP Location: Right Arm, Patient Position: Sitting, Cuff Size: Large)   Pulse 77   Ht 6' (1.829 m)   Wt 233 lb (105.7 kg)   SpO2 99%   BMI 31.60 kg/m  BP Readings from Last 3 Encounters:  10/31/23 (!) 148/86  10/22/23 (!) 160/70  06/09/23 128/78   Wt Readings from Last 3 Encounters:  10/31/23 233 lb (105.7 kg)  10/22/23 232 lb 12.9 oz (105.6 kg)  06/09/23 227 lb (103 kg)        Physical Exam  General: Alert, no acute distress  Cardio: Normal S1 and S2, RRR, no r/m/g Pulm: CTAB, normal work of breathing ABD: soft, abdomen is not distended, there is no tenderness to palpation, normal BS     Results for orders placed or performed in visit on 10/31/23  POCT HgB A1C  Result Value Ref Range   Hemoglobin A1C 6.4 (A) 4.0 - 5.6 %   HbA1c POC (<> result, manual entry)     HbA1c, POC (prediabetic range)     HbA1c, POC (controlled diabetic range)      Assessment & Plan     Problem List Items Addressed This Visit       Cardiovascular and Mediastinum   Hypertension associated with diabetes (HCC)   Relevant Orders   Urine microalbumin-creatinine with uACR     Genitourinary   CKD stage 3a, GFR 45-59 ml/min (HCC)   Relevant Orders   Urine microalbumin-creatinine with uACR   Other Visit Diagnoses       Acute cystitis with hematuria    -  Primary     Urinary frequency       Relevant Orders   POCT HgB A1C (Completed)     Pre-diabetes       Relevant Orders   Urine microalbumin-creatinine with uACR   POCT HgB A1C (Completed)     Sweating increase       Relevant Orders   TSH+T4F+T3Free   CMP14+EGFR   QuantiFERON-TB Gold Plus       Assessment & Plan Acute urinary tract infection Recent UTI treated with IV ceftriaxone  in ED and oral Keflex  500 mg TID for 10 days, ending tomorrow. Symptoms improving but still present. Urine culture showed resistance only to ampicillin.  Discussion about the adequacy of antibiotic treatment and potential need for further evaluation if symptoms persist. - Complete current course of Keflex  - Perform urinalysis to assess for persistent infection - Consider urology referral if symptoms persist after antibiotic course  Hypertension Blood pressure recorded at 148/86 mmHg in the office. Usual home readings around 135/70 mmHg. Currently taking lisinopril  40 mg, not taking amlodipine  2.5 mg. Discussion about potential side effects of amlodipine , such as edema, especially at higher doses. Sinus medication may be contributing to elevated readings. - Continue lisinopril  40 mg daily - Recheck blood pressure in a few weeks - Monitor blood pressure at home with goal of 120s/70s - Hold off on adding amlodipine  unless necessary - Reassess sinus medication use  Chronic kidney disease, stage unspecified Chronic kidney disease with previous creatinine of 1.84 mg/dL and GFR of 41. No current nephrology follow-up. Previous biopsy did not indicate progression requiring further intervention. He declined further study at [facility]. - Consider rechecking kidney function with metabolic panel  Prediabetes A1c remains at 6.4%, consistent with prediabetes. No current diagnosis of diabetes. Discussion about monitoring A1c levels to assess progression. - Check A1c levels today  Graves' disease Graves' disease managed without endocrinology follow-up. No current symptoms suggestive of active disease. Monitoring thyroid  function as part of routine care. - Monitor thyroid  function as part of routine care  Hyperhidrosis Episodes of sweating, particularly on the forehead, noted since UTI. Differential includes hyperhidrosis, thyroid  dysfunction, and other systemic causes. Discussion about potential causes and plan to investigate further. - Perform metabolic panel to assess thyroid  function and other potential causes of sweating     Return in about 1  month (around 12/01/2023) for HTN,sweating f/u . Aaron Aas        Mimi Alt, MD  [facility]  [phone] (phone) [phone] (fax)  [facility]

## 2023-11-01 LAB — MICROALBUMIN / CREATININE URINE RATIO
Creatinine, Urine: 162.7 mg/dL
Microalb/Creat Ratio: 1090 mg/g{creat} — ABNORMAL HIGH (ref 0–29)
Microalbumin, Urine: 1773.5 ug/mL

## 2023-11-02 ENCOUNTER — Other Ambulatory Visit: Payer: Self-pay | Admitting: Physician Assistant

## 2023-11-02 DIAGNOSIS — N1831 Chronic kidney disease, stage 3a: Secondary | ICD-10-CM

## 2023-11-02 DIAGNOSIS — E890 Postprocedural hypothyroidism: Secondary | ICD-10-CM

## 2023-11-02 DIAGNOSIS — E1159 Type 2 diabetes mellitus with other circulatory complications: Secondary | ICD-10-CM

## 2023-11-02 DIAGNOSIS — E118 Type 2 diabetes mellitus with unspecified complications: Secondary | ICD-10-CM

## 2023-11-03 ENCOUNTER — Other Ambulatory Visit: Payer: Self-pay

## 2023-11-03 ENCOUNTER — Other Ambulatory Visit: Payer: Self-pay | Admitting: Family Medicine

## 2023-11-03 DIAGNOSIS — I152 Hypertension secondary to endocrine disorders: Secondary | ICD-10-CM

## 2023-11-03 DIAGNOSIS — L209 Atopic dermatitis, unspecified: Secondary | ICD-10-CM

## 2023-11-03 DIAGNOSIS — E118 Type 2 diabetes mellitus with unspecified complications: Secondary | ICD-10-CM

## 2023-11-03 DIAGNOSIS — E890 Postprocedural hypothyroidism: Secondary | ICD-10-CM

## 2023-11-03 DIAGNOSIS — N1831 Chronic kidney disease, stage 3a: Secondary | ICD-10-CM

## 2023-11-03 NOTE — Telephone Encounter (Signed)
 Patient states these were to be sent in on Friday.  Since they haven't been sent yet I wanted to ask you to review the labs done on 10/31/23 and see if any doses needed changing. Thanks

## 2023-11-03 NOTE — Telephone Encounter (Signed)
 The patient saw his provider on Friday and was told these meds were going to be called into his pharmacy. The pharmacy has still not received the refill request and he needs these as soon as possible. Please assist patient further as he got an emergency fill for 5 days but they are waiting on these scripts from his provider so he needs these by Wednesday as he will be totally out then.  [facility] [address] ([address]), [place] - [address] Phone: [phone]  Fax: [phone]

## 2023-11-03 NOTE — Telephone Encounter (Signed)
 [facility] faxed refill request for the following medications:   lisinopril  (ZESTRIL ) 40 MG tablet     levothyroxine  (EUTHYROX ) 150 MCG tablet     Please advise.

## 2023-11-04 ENCOUNTER — Other Ambulatory Visit: Payer: Self-pay

## 2023-11-04 DIAGNOSIS — E890 Postprocedural hypothyroidism: Secondary | ICD-10-CM

## 2023-11-04 DIAGNOSIS — E118 Type 2 diabetes mellitus with unspecified complications: Secondary | ICD-10-CM

## 2023-11-04 DIAGNOSIS — E1159 Type 2 diabetes mellitus with other circulatory complications: Secondary | ICD-10-CM

## 2023-11-04 DIAGNOSIS — N1831 Chronic kidney disease, stage 3a: Secondary | ICD-10-CM

## 2023-11-04 LAB — CMP14+EGFR
ALT: 19 IU/L (ref 0–44)
AST: 21 IU/L (ref 0–40)
Albumin: 3.6 g/dL — ABNORMAL LOW (ref 3.8–4.9)
Alkaline Phosphatase: 77 IU/L (ref 44–121)
BUN/Creatinine Ratio: 10 (ref 10–24)
BUN: 16 mg/dL (ref 8–27)
Bilirubin Total: 0.3 mg/dL (ref 0.0–1.2)
CO2: 24 mmol/L (ref 20–29)
Calcium: 9 mg/dL (ref 8.6–10.2)
Chloride: 101 mmol/L (ref 96–106)
Creatinine, Ser: 1.61 mg/dL — ABNORMAL HIGH (ref 0.76–1.27)
Globulin, Total: 2.9 g/dL (ref 1.5–4.5)
Glucose: 85 mg/dL (ref 70–99)
Potassium: 4.1 mmol/L (ref 3.5–5.2)
Sodium: 141 mmol/L (ref 134–144)
Total Protein: 6.5 g/dL (ref 6.0–8.5)
eGFR: 49 mL/min/{1.73_m2} — ABNORMAL LOW (ref >59)

## 2023-11-04 LAB — QUANTIFERON-TB GOLD PLUS
QuantiFERON Mitogen Value: 8.87 [IU]/mL
QuantiFERON Nil Value: 0.07 [IU]/mL
QuantiFERON TB1 Ag Value: 0.1 [IU]/mL
QuantiFERON TB2 Ag Value: 0.1 [IU]/mL

## 2023-11-04 LAB — TSH+T4F+T3FREE
Free T4: 1.03 ng/dL (ref 0.82–1.77)
T3, Free: 2.3 pg/mL (ref 2.0–4.4)
TSH: 8.56 u[IU]/mL — ABNORMAL HIGH (ref 0.450–4.500)

## 2023-11-04 MED ORDER — LORATADINE 10 MG PO TABS: 10.0000 mg | ORAL_TABLET | Freq: Every day | ORAL | 1 refills | Status: DC

## 2023-11-04 MED ORDER — LISINOPRIL 40 MG PO TABS: 40.0000 mg | ORAL_TABLET | Freq: Every day | ORAL | 1 refills | Status: DC

## 2023-11-04 MED ORDER — LEVOTHYROXINE SODIUM 137 MCG PO TABS: 137.0000 ug | ORAL_TABLET | Freq: Every day | ORAL | 1 refills | Status: DC

## 2023-11-04 NOTE — Telephone Encounter (Signed)
 Patient was in on 10/31/23 and he states you were going to send in his meds that day.  You did labs and TSH was abnormal.  Were you going to make any changes or can I send this as is?

## 2023-11-04 NOTE — Telephone Encounter (Signed)
 Medication refills have been sent to pharmacy.   Updated dose of euthyrox  as thyroid  levels were abnormal and will plan to recheck at follow up appt

## 2023-11-05 ENCOUNTER — Other Ambulatory Visit: Payer: Self-pay | Admitting: Physician Assistant

## 2023-11-05 DIAGNOSIS — E118 Type 2 diabetes mellitus with unspecified complications: Secondary | ICD-10-CM

## 2023-11-05 DIAGNOSIS — E1159 Type 2 diabetes mellitus with other circulatory complications: Secondary | ICD-10-CM

## 2023-11-05 DIAGNOSIS — N1831 Chronic kidney disease, stage 3a: Secondary | ICD-10-CM

## 2023-11-06 NOTE — Telephone Encounter (Signed)
 The requested medication was sent to the pharmacy the same day it was requested on 5/6

## 2023-11-06 NOTE — Telephone Encounter (Signed)
 Copied from CRM [id number]. Topic: Clinical - Medication Question >> Nov 06, 2023  9:15 AM Turkey B wrote: Reason for CRM: pt called in about status of lisinopril , fist called on on May 6. I let him know this is being worked on, but pt was still concerned why he doesn't have it yet and he is out. He has his other med but not this one yet. I also let know of 48-72hr turn around. He states a nurse was supposed to call him back before about the status, but I saw no notes about this

## 2023-11-10 ENCOUNTER — Encounter: Payer: Self-pay | Admitting: Family Medicine

## 2023-11-10 NOTE — Addendum Note (Signed)
 Addended by: SIMMONS-ROBINSON, Alexcis Bicking L on: 11/10/2023 07:20 AM   Modules accepted: Orders

## 2023-11-11 ENCOUNTER — Telehealth: Payer: Self-pay

## 2023-11-11 ENCOUNTER — Ambulatory Visit: Payer: Self-pay

## 2023-11-11 DIAGNOSIS — N1831 Chronic kidney disease, stage 3a: Secondary | ICD-10-CM

## 2023-11-11 DIAGNOSIS — R809 Proteinuria, unspecified: Secondary | ICD-10-CM

## 2023-11-11 NOTE — Telephone Encounter (Unsigned)
 Copied from CRM [id number]. Topic: Clinical - Lab/Test Results >> Nov 11, 2023 11:21 AM Juluis Ok wrote: Reason for CRM: Patient's spouse returning missed phone call. Relayed lab results per provider's not, verbatim. Patient's spouse has additional questions and request a callback at [phone].

## 2023-12-11 ENCOUNTER — Ambulatory Visit: Admitting: Family Medicine

## 2023-12-11 NOTE — Progress Notes (Deleted)
 Established patient visit   Patient: Scott Holloway   DOB: [birth year]   58 y.o. Male  MRN: [id number] Visit Date: 12/11/2023  Today's healthcare provider: Mimi Alt, MD   No chief complaint on file.  Subjective       Discussed the use of AI scribe software for clinical note transcription with the patient, who gave verbal consent to proceed.  History of Present Illness      Past Medical History:  Diagnosis Date   Graves disease 11/03/2017   Hyperlipidemia 11/03/2017   Hypertension 11/03/2017   Hypothyroidism 11/03/2017   Pneumothorax 1997   Pneumothorax 07/02/1995    Medications: Outpatient Medications Prior to Visit  Medication Sig   amLODipine  (NORVASC ) 5 MG tablet Take 0.5 tablets (2.5 mg total) by mouth daily. Increase to 5 mg as tolerated Consistent BP goal remains <119 and <79   atorvastatin  (LIPITOR) 80 MG tablet Take 1 tablet (80 mg total) by mouth daily.   CALCIUM  PO Take by mouth.   ezetimibe  (ZETIA ) 10 MG tablet Take 1 tablet (10 mg total) by mouth daily.   levothyroxine  (EUTHYROX ) 137 MCG tablet Take 1 tablet (137 mcg total) by mouth daily before breakfast.   lisinopril  (ZESTRIL ) 40 MG tablet Take 1 tablet by mouth once daily   loratadine  (CLARITIN ) 10 MG tablet Take 1 tablet (10 mg total) by mouth daily.   Multiple Vitamin (MULTIVITAMIN ADULT PO) Take by mouth.   Omega-3 Fatty Acids (FISH OIL ) 1000 MG CAPS Take 4 capsules (4,000 mg total) by mouth daily at 12 noon.   triamcinolone  cream (KENALOG ) 0.1 % Apply 1 Application topically 2 (two) times daily. (Patient taking differently: Apply 1 Application topically 2 (two) times daily. Taking as needed)   No facility-administered medications prior to visit.    Review of Systems  Last metabolic panel Lab Results  Component Value Date   GLUCOSE 85 10/31/2023   NA 141 10/31/2023   K 4.1 10/31/2023   CL 101 10/31/2023   CO2 24 10/31/2023   BUN 16 10/31/2023   CREATININE 1.61 (H) 10/31/2023    EGFR 49 (L) 10/31/2023   CALCIUM  9.0 10/31/2023   PHOS 4.4 (H) 01/14/2019   PROT 6.5 10/31/2023   ALBUMIN 3.6 (L) 10/31/2023   LABGLOB 2.9 10/31/2023   AGRATIO 1.4 10/07/2022   BILITOT 0.3 10/31/2023   ALKPHOS 77 10/31/2023   AST 21 10/31/2023   ALT 19 10/31/2023   ANIONGAP 9 10/22/2023   Last hemoglobin A1c Lab Results  Component Value Date   HGBA1C 6.4 (A) 10/31/2023   Last thyroid  functions Lab Results  Component Value Date   TSH 8.560 (H) 10/31/2023   T4TOTAL 6.7 12/10/2018     {See past labs  Heme  Chem  Endocrine  Serology  Results Review (optional):1}   Objective    There were no vitals taken for this visit. BP Readings from Last 3 Encounters:  10/31/23 (!) 148/86  10/22/23 (!) 160/70  06/09/23 128/78   Wt Readings from Last 3 Encounters:  10/31/23 233 lb (105.7 kg)  10/22/23 232 lb 12.9 oz (105.6 kg)  06/09/23 227 lb (103 kg)    {See vitals history (optional):1}    Physical Exam  ***  No results found for any visits on 12/11/23.  Assessment & Plan     Problem List Items Addressed This Visit   None   Assessment & Plan      No follow-ups on file.  Mimi Alt, MD  [facility] [phone] (phone) [phone] (fax)  [facility]

## 2023-12-29 DIAGNOSIS — M109 Gout, unspecified: Secondary | ICD-10-CM | POA: Diagnosis not present

## 2024-01-25 ENCOUNTER — Other Ambulatory Visit: Payer: Self-pay | Admitting: Family Medicine

## 2024-01-25 DIAGNOSIS — I152 Hypertension secondary to endocrine disorders: Secondary | ICD-10-CM

## 2024-01-25 DIAGNOSIS — N1831 Chronic kidney disease, stage 3a: Secondary | ICD-10-CM

## 2024-01-25 DIAGNOSIS — E118 Type 2 diabetes mellitus with unspecified complications: Secondary | ICD-10-CM

## 2024-02-25 ENCOUNTER — Encounter: Payer: Self-pay | Admitting: Family Medicine

## 2024-02-25 ENCOUNTER — Ambulatory Visit: Admitting: Family Medicine

## 2024-02-25 VITALS — BP 138/85 | HR 76 | Wt 236.4 lb

## 2024-02-25 DIAGNOSIS — N1831 Chronic kidney disease, stage 3a: Secondary | ICD-10-CM | POA: Diagnosis not present

## 2024-02-25 DIAGNOSIS — E1159 Type 2 diabetes mellitus with other circulatory complications: Secondary | ICD-10-CM

## 2024-02-25 DIAGNOSIS — I152 Hypertension secondary to endocrine disorders: Secondary | ICD-10-CM

## 2024-02-25 DIAGNOSIS — M1049 Other secondary gout, multiple sites: Secondary | ICD-10-CM

## 2024-02-25 DIAGNOSIS — E118 Type 2 diabetes mellitus with unspecified complications: Secondary | ICD-10-CM | POA: Diagnosis not present

## 2024-02-25 MED ORDER — PREDNISONE 20 MG PO TABS: ORAL_TABLET | ORAL | 0 refills | Status: AC

## 2024-02-25 MED ORDER — ALLOPURINOL 100 MG PO TABS: 100.0000 mg | ORAL_TABLET | Freq: Every day | ORAL | 6 refills | Status: DC

## 2024-02-25 NOTE — Patient Instructions (Signed)
 Gout  Gout is painful swelling of your joints. Gout is a type of arthritis. It is caused by having too much uric acid in your body. Uric acid is a chemical that is made when your body breaks down substances called purines. If your body has too much uric acid, sharp crystals can form and build up in your joints. This causes pain and swelling. Gout attacks can happen quickly and be very painful (acute gout). Over time, the attacks can affect more joints and happen more often (chronic gout). What are the causes? Gout is caused by too much uric acid in your blood. This can happen because: Your kidneys do not remove enough uric acid from your blood. Your body makes too much uric acid. You eat too many foods that are high in purines. These foods include organ meats, some seafood, and beer. Trauma or stress can bring on an attack. What increases the risk? Having a family history of gout. Being male and middle-aged. Being male and having gone through menopause. Having an organ transplant. Taking certain medicines. Having certain conditions, such as: Being very overweight (obese). Lead poisoning. Kidney disease. A skin condition called psoriasis. Other risks include: Losing weight too quickly. Not having enough water in the body (being dehydrated). Drinking alcohol, especially beer. Drinking beverages that are sweetened with a type of sugar called fructose. What are the signs or symptoms? An attack of acute gout often starts at night and usually happens in just one joint. The most common place is the big toe. Other joints that may be affected include joints of the feet, ankle, knee, fingers, wrist, or elbow. Symptoms may include: Very bad pain. Warmth. Swelling. Stiffness. Tenderness. The affected joint may be very painful to touch. Shiny, red, or purple skin. Chills and fever. Chronic gout may cause symptoms more often. More joints may be involved. You may also have white or yellow lumps  (tophi) on your hands or feet or in other areas near your joints. How is this treated? Treatment for an acute attack may include medicines for pain and swelling, such as: NSAIDs, such as ibuprofen. Steroids taken by mouth or injected into a joint. Colchicine. This can be given by mouth or through an IV tube. Treatment to prevent future attacks may include: Taking small doses of NSAIDs or colchicine daily. Using a medicine that reduces uric acid levels in your blood, such as allopurinol. Making changes to your diet. You may need to see a food expert (dietitian) about what to eat and drink to prevent gout. Follow these instructions at home: During a gout attack  If told, put ice on the painful area. To do this: Put ice in a plastic bag. Place a towel between your skin and the bag. Leave the ice on for 20 minutes, 2-3 times a day. Take off the ice if your skin turns bright red. This is very important. If you cannot feel pain, heat, or cold, you have a greater risk of damage to the area. Raise the painful joint above the level of your heart as often as you can. Rest the joint as much as possible. If the joint is in your leg, you may be given crutches. Follow instructions from your doctor about what you cannot eat or drink. Avoiding future gout attacks Eat a low-purine diet. Avoid foods and drinks such as: Liver. Kidney. Anchovies. Asparagus. Herring. Mushrooms. Mussels. Beer. Stay at a healthy weight. If you want to lose weight, talk with your doctor. Do not  lose weight too fast. Start or continue an exercise plan as told by your doctor. Eating and drinking Avoid drinks sweetened by fructose. Drink enough fluids to keep your pee (urine) pale yellow. If you drink alcohol: Limit how much you have to: 0-1 drink a day for women who are not pregnant. 0-2 drinks a day for men. Know how much alcohol is in a drink. In the [place] one drink equals one 12 oz bottle of beer (355 mL), one 5 oz  glass of wine (148 mL), or one 1 oz glass of hard liquor (44 mL). General instructions Take over-the-counter and prescription medicines only as told by your doctor. Ask your doctor if you should avoid driving or using machines while you are taking your medicine. Return to your normal activities when your doctor says that it is safe. Keep all follow-up visits. Where to find more information [employer]: www.niams.http://www.myers.net/ Contact a doctor if: You have another gout attack. You still have symptoms of a gout attack after 10 days of treatment. You have problems (side effects) because of your medicines. You have chills or a fever. You have burning pain when you pee (urinate). You have pain in your lower back or belly. Get help right away if: You have very bad pain. Your pain cannot be controlled. You cannot pee. Summary Gout is painful swelling of the joints. The most common site of pain is the big toe, but it can affect other joints. Medicines and avoiding some foods can help to prevent and treat gout attacks. This information is not intended to replace advice given to you by your health care provider. Make sure you discuss any questions you have with your health care provider. Document Revised: 03/21/2021 Document Reviewed: 03/21/2021 Elsevier Patient Education  2024 [employer].

## 2024-02-25 NOTE — Progress Notes (Signed)
 Established patient visit   Patient: Scott Holloway   DOB: February 04, 1966   58 y.o. Male  MRN: [id number] Visit Date: 02/25/2024  Today's healthcare provider: Rockie Agent, MD   Chief Complaint  Patient presents with   Follow-up    HTN and Sweating   Gout   Subjective     HPI     Follow-up    Additional comments: HTN and Sweating      Last edited by Rosas, Joseline E, CMA on 02/25/2024  1:51 PM.       Discussed the use of AI scribe software for clinical note transcription with the patient, who gave verbal consent to proceed.  History of Present Illness Scott Holloway is a 58 year old male with severely elevated blood pressure, diabetes, and chronic kidney disease stage 3A who presents for follow-up.  He continues to take amlodipine  2.5 mg daily, with an increase to 5 mg as tolerated, and lisinopril  40 mg daily. His blood pressure readings have been 170/81 and 138/85. His last A1c was 6.4, and his last metabolic panel showed a creatinine of 1.61 with a GFR of 49.  He experienced severe pain in his left foot three weeks ago, leading to a visit to a walk-in clinic where he was told he might have gout. Prednisone  was prescribed, which alleviated the pain. He has avoided red meat but had a recurrence of pain after consuming a hamburger. He has been using ibuprofen for pain relief. He also has pain in his left knee and elbow, which he suspects may be related to gout. His sister mentioned that gout can move from joint to joint.  He has a history of chronic kidney disease stage 3A, with a GFR of 49. He previously consulted a kidney specialist who informed him that his kidney function was stable but would not improve. He has not returned to the nephrologist since then.  He denies regular alcohol consumption, stating 'I'm married, if I was drunk, my wife would not have me.' No fevers or chills. Resolved episodes of sweating.     Past Medical History:  Diagnosis Date    Graves disease 11/03/2017   Hyperlipidemia 11/03/2017   Hypertension 11/03/2017   Hypothyroidism 11/03/2017   Pneumothorax 1997   Pneumothorax 07/02/1995    Medications: Outpatient Medications Prior to Visit  Medication Sig   amLODipine  (NORVASC ) 5 MG tablet Take 0.5 tablets (2.5 mg total) by mouth daily. Increase to 5 mg as tolerated Consistent BP goal remains <119 and <79   atorvastatin  (LIPITOR) 80 MG tablet Take 1 tablet (80 mg total) by mouth daily.   CALCIUM  PO Take by mouth.   ezetimibe  (ZETIA ) 10 MG tablet Take 1 tablet (10 mg total) by mouth daily.   levothyroxine  (EUTHYROX ) 137 MCG tablet Take 1 tablet (137 mcg total) by mouth daily before breakfast.   lisinopril  (ZESTRIL ) 40 MG tablet Take 1 tablet by mouth once daily   loratadine  (CLARITIN ) 10 MG tablet Take 1 tablet (10 mg total) by mouth daily.   Multiple Vitamin (MULTIVITAMIN ADULT PO) Take by mouth.   Omega-3 Fatty Acids (FISH OIL ) 1000 MG CAPS Take 4 capsules (4,000 mg total) by mouth daily at 12 noon.   triamcinolone  cream (KENALOG ) 0.1 % Apply 1 Application topically 2 (two) times daily. (Patient taking differently: Apply 1 Application topically 2 (two) times daily. Taking as needed)   No facility-administered medications prior to visit.    Review of Systems  Last metabolic  panel Lab Results  Component Value Date   GLUCOSE 85 10/31/2023   NA 141 10/31/2023   K 4.1 10/31/2023   CL 101 10/31/2023   CO2 24 10/31/2023   BUN 16 10/31/2023   CREATININE 1.61 (H) 10/31/2023   EGFR 49 (L) 10/31/2023   CALCIUM  9.0 10/31/2023   PHOS 4.4 (H) 01/14/2019   PROT 6.5 10/31/2023   ALBUMIN 3.6 (L) 10/31/2023   LABGLOB 2.9 10/31/2023   AGRATIO 1.4 10/07/2022   BILITOT 0.3 10/31/2023   ALKPHOS 77 10/31/2023   AST 21 10/31/2023   ALT 19 10/31/2023   ANIONGAP 9 10/22/2023   Last lipids Lab Results  Component Value Date   CHOL 189 04/08/2023   HDL 47 04/08/2023   LDLCALC 114 (H) 04/08/2023   TRIG 157 (H) 04/08/2023    CHOLHDL 4.0 04/08/2023   Last hemoglobin A1c Lab Results  Component Value Date   HGBA1C 6.4 (A) 10/31/2023        Objective    BP 138/85 (BP Location: Right Arm, Patient Position: Sitting, Cuff Size: Large)   Pulse 76   Wt 236 lb 6.4 oz (107.2 kg)   SpO2 99%   BMI 32.06 kg/m  BP Readings from Last 3 Encounters:  02/25/24 138/85  10/31/23 (!) 148/86  10/22/23 (!) 160/70   Wt Readings from Last 3 Encounters:  02/25/24 236 lb 6.4 oz (107.2 kg)  10/31/23 233 lb (105.7 kg)  10/22/23 232 lb 12.9 oz (105.6 kg)        Physical Exam  Physical Exam VITALS: BP- 138/85 EXTREMITIES: Left knee is edematous and tender to palpation with limited range of motion due to pain. Left foot exhibits normal range of motion. Bilateral posterior tibialis and dorsalis pedis pulses are palpable. NEUROLOGICAL: Monofilament test is normal on bilateral feet. SKIN: Normal skin appearance on both feet with no ulcers or skin breakdown.    No results found for any visits on 02/25/24.  Assessment & Plan     Problem List Items Addressed This Visit     CKD stage 3a, GFR 45-59 ml/min (HCC)   Controlled type 2 diabetes mellitus with complication, without long-term current use of insulin (HCC)   Hypertension associated with diabetes (HCC) - Primary   Other Visit Diagnoses       Other secondary acute gout of multiple sites       Relevant Medications   predniSONE  (DELTASONE ) 20 MG tablet   allopurinol  (ZYLOPRIM ) 100 MG tablet (Start on 03/10/2024)        Assessment & Plan Chronic hypertension  in the setting of type 2 diabetes mellitus and chronic kidney disease stage 3A Blood pressure remains elevated at 170/81 mmHg, though improved to 138/85 mmHg during the visit. Continues on amlodipine  2.5 mg daily with potential increase to 5 mg as tolerated. Lisinopril  40 mg daily is maintained. Blood pressure goal is 119/60-70 mmHg. Last A1c was well controlled at 6.4%. - Continue amlodipine  2.5 mg daily,  increase to 5 mg as tolerated. - Continue lisinopril  40 mg daily.  Acute on chronic Gout   Gout flare involving the left knee and left foot, with tenderness and warmth noted in the knee. Reports dietary triggers including red meat and shellfish. Discussed dietary modifications to prevent future flares. Prednisone  40 mg for 3 days followed by 20 mg for 3 days prescribed for current flare. Allopurinol  100 mg daily to be started after resolution of acute symptoms to prevent future flares. Advised to avoid red meat, shellfish, and  alcohol to prevent exacerbations. - Prescribe prednisone  40 mg daily for 3 days, then 20 mg daily for 3 days. - Prescribe allopurinol  100 mg daily, to be started after resolution of acute symptoms, approximately September 10th. - Counsel on dietary modifications: avoid red meat, shellfish, and alcohol.  Chronic kidney disease stage 3A Chronic kidney disease stage 3A with a GFR of 49 mL/min/1.73 m. Previous nephrology evaluation indicated stable kidney function. Discussed potential nephrology referral if CKD progresses to stage 3B. Current management includes monitoring kidney function and adjusting medications as needed for kidney safety. Expressed reluctance to undergo invasive procedures again, but is open to monitoring and non-invasive testing. - Monitor kidney function with repeat testing in November. - Adjust medication dosages as needed for kidney safety. - Hold off on nephrology referral unless CKD progresses to stage 3B. - recommend that he continue ACEi, would suggest farxiga or jardiance in the future      Return in about 3 months (around 05/31/2024) for DM, HTN.         Rockie Agent, MD  [facility] [phone] (phone) [phone] (fax)  [facility]

## 2024-03-02 ENCOUNTER — Ambulatory Visit: Payer: Self-pay

## 2024-03-02 NOTE — Telephone Encounter (Signed)
 This RN attempted to call patient, no answer, unable to leave VM due to mailbox being full.     Copied from CRM [id number]. Topic: Clinical - Medication Question >> Mar 02, 2024  2:22 PM Scott Holloway wrote: Reason for CRM: patient is calling in wants to know if he should start the allopurinol  (ZYLOPRIM ) 100 MG tablet he said he was told that if he was still in pain after takingallopurinol (ZYLOPRIM ) 100 MG tablet  to call in and patient is still in pain not as bad as it was it is now mild pain  please  call patient back [phone] (M)

## 2024-03-02 NOTE — Telephone Encounter (Signed)
 Third attempt: unable to leave voicemail, mailbox full.  Call routed to [facility]  Copied from CRM [id number]. Topic: Clinical - Medication Question >> Mar 02, 2024  2:22 PM Scott Holloway wrote: Reason for CRM: patient is calling in wants to know if he should start the allopurinol  (ZYLOPRIM ) 100 MG tablet he said he was told that if he was still in pain after takingallopurinol (ZYLOPRIM ) 100 MG tablet  to call in and patient is still in pain not as bad as it was it is now mild pain  please  call patient back [phone] (M)

## 2024-03-02 NOTE — Telephone Encounter (Signed)
  Second attempt: Unable to LVM, mailbox full  Copied from CRM #[id number]. Topic: Clinical - Medication Question >> Mar 02, 2024  2:22 PM Geneva B wrote: Reason for CRM: patient is calling in wants to know if he should start the allopurinol  (ZYLOPRIM ) 100 MG tablet he said he was told that if he was still in pain after takingallopurinol (ZYLOPRIM ) 100 MG tablet  to call in and patient is still in pain not as bad as it was it is now mild pain  please  call patient back [phone] (M)

## 2024-03-02 NOTE — Telephone Encounter (Signed)
 FYI Only or Action Required?: Action required by provider: update on patient condition.  Patient was last seen in primary care on 02/25/2024 by [name], MD.  Called Nurse Triage reporting Medication Question.  Symptoms began about 10 days ago.  Interventions attempted: Prescription medications: Prednisone .  Symptoms are: gradually improving.  Triage Disposition: Home Cares  Patient/caregiver understands and will follow disposition?: Yes          Copied from CRM [id number]. Topic: Clinical - Medication Question >> Mar 02, 2024  2:22 PM Scott Holloway wrote: Reason for CRM: patient is calling in wants to know if he should start the allopurinol  (ZYLOPRIM ) 100 MG tablet he said he was told that if he was still in pain after takingallopurinol (ZYLOPRIM ) 100 MG tablet  to call in and patient is still in pain not as bad as it was it is now mild pain  please  call patient back [phone] (M) >> Mar 02, 2024  5:01 PM Scott Holloway wrote: Patient called in to follow up on medication, transferred to [facility] with E2C2 NT          Reason for Disposition  Knee pain  Answer Assessment - Initial Assessment Questions 1. NAME of MEDICINE: What medicine(s) are you calling about?     Allopurinol   2. QUESTION: What is your question? (e.g., double dose of medicine, side effect)     Should I start  3. PRESCRIBER: Who prescribed the medicine? Reason: if prescribed by specialist, call should be referred to that group.     Dr. Sharma  4. SYMPTOMS: Do you have any symptoms? If Yes, ask: What symptoms are you having?  How bad are the symptoms (e.g., mild, moderate, severe)     Pain improved to 3/10 from 8/10 but is still present  5. PREGNANCY:  Is there any chance that you are pregnant? When was your last menstrual period?     *No Answer*  Answer Assessment - Initial Assessment Questions Patient called to report that his pain has improved from 8/10 to 2/10, but  that the pain is still there. Patient would like to know if there are any other treatments that the doctor would like to try, and was asking about starting his Allopurinol  due to her wanting him to wait until his symptoms resolved. I advised to hold the medication as instructed. Please advise.          1. LOCATION and RADIATION: Where is the pain located?      Left knee 2. QUALITY: What does the pain feel like?  (e.g., sharp, dull, aching, burning)     Dull aching  3. SEVERITY: How bad is the pain? What does it keep you from doing?   (Scale 1-10; or mild, moderate, severe)     2/10 4. ONSET: When did the pain start? Does it come and go, or is it there all the time?     10 days ago 5. RECURRENT: Have you had this pain before? If Yes, ask: When, and what happened then?     Yes, being treated for gout  6. SETTING: Has there been any recent work, exercise or other activity that involved that part of the body?      No 8. ASSOCIATED SYMPTOMS: Is there any swelling or redness of the knee?     No 9. OTHER SYMPTOMS: Do you have any other symptoms? (e.g., calf pain, chest pain, difficulty breathing, fever)     No  Protocols used: Medication Question Call-A-AH,  Knee Pain-A-AH

## 2024-03-03 ENCOUNTER — Ambulatory Visit: Payer: Self-pay | Admitting: *Deleted

## 2024-03-03 ENCOUNTER — Other Ambulatory Visit: Payer: Self-pay | Admitting: Family Medicine

## 2024-03-03 DIAGNOSIS — M10472 Other secondary gout, left ankle and foot: Secondary | ICD-10-CM

## 2024-03-03 MED ORDER — PREDNISONE 20 MG PO TABS: 20.0000 mg | ORAL_TABLET | Freq: Every day | ORAL | 0 refills | Status: AC

## 2024-03-03 MED ORDER — PREDNISONE 20 MG PO TABS: 20.0000 mg | ORAL_TABLET | Freq: Every day | ORAL | 0 refills | Status: DC

## 2024-03-03 NOTE — Telephone Encounter (Signed)
 Would recommend another dose

## 2024-03-03 NOTE — Telephone Encounter (Unsigned)
 Copied from CRM [id number]. Topic: General - Other >> Mar 03, 2024  2:26 PM Rachelle R wrote: Reason for CRM: Patient is requesting a callback from the office in regards to his call yesterday: -patient is calling in wants to know if he should start the allopurinol  (ZYLOPRIM ) 100 MG tablet he said he was told that if he was still in pain after takingallopurinol (ZYLOPRIM ) 100 MG tablet  to call in and patient is still in pain not as bad as it was it is now mild pain  please  call patient back [phone] (M)  Advised waiting on response from provider but patient states if he does nothing the pain will only get worse.

## 2024-03-03 NOTE — Telephone Encounter (Signed)
 Recommend an additional course of steroids for 5 days and then patient can start the allopurinol  since he is still having pain from gout flare.   Prescription has been sent to his pharmacy.

## 2024-03-03 NOTE — Addendum Note (Signed)
 Addended by: SIMMONS-ROBINSON, Burnis Kaser L on: 03/03/2024 10:58 PM   Modules accepted: Orders

## 2024-03-03 NOTE — Telephone Encounter (Signed)
 Opened chart to answer question.    Pt calling back in from triage call yesterday 03/02/2024.  Seeking medication advice.   Had Patient Access Specialist transfer call to CAL at [facility] because provider will need to provide needed medication advice.    No response from yesterday's request from provider.

## 2024-03-03 NOTE — Telephone Encounter (Signed)
 Copied from CRM #[id number]. Topic: Clinical - Red Word Triage >> Mar 03, 2024  2:22 PM Rachelle R wrote: [facility] that prompted transfer to Nurse Triage: Patient states he is still having pain (see triage from yesterday)  and is waiting on a response from Dr Marcine Essex on what he should be doing with his medications. Took aleve this morning and he is not sure if he is able to take that, but if he does nothing the pain will get worse.

## 2024-03-04 NOTE — Telephone Encounter (Signed)
 NA just keeps ringing will try later and will send provider's message through mychart.

## 2024-04-26 ENCOUNTER — Other Ambulatory Visit: Payer: Self-pay | Admitting: Family Medicine

## 2024-04-26 DIAGNOSIS — I152 Hypertension secondary to endocrine disorders: Secondary | ICD-10-CM

## 2024-04-26 DIAGNOSIS — N1831 Chronic kidney disease, stage 3a: Secondary | ICD-10-CM

## 2024-04-26 DIAGNOSIS — E890 Postprocedural hypothyroidism: Secondary | ICD-10-CM

## 2024-04-26 DIAGNOSIS — E118 Type 2 diabetes mellitus with unspecified complications: Secondary | ICD-10-CM

## 2024-04-27 ENCOUNTER — Telehealth: Payer: Self-pay | Admitting: Family Medicine

## 2024-04-27 DIAGNOSIS — E118 Type 2 diabetes mellitus with unspecified complications: Secondary | ICD-10-CM

## 2024-04-27 DIAGNOSIS — E1169 Type 2 diabetes mellitus with other specified complication: Secondary | ICD-10-CM

## 2024-04-27 MED ORDER — ATORVASTATIN CALCIUM 80 MG PO TABS: 80.0000 mg | ORAL_TABLET | Freq: Every day | ORAL | 2 refills | Status: AC

## 2024-04-27 NOTE — Telephone Encounter (Signed)
 Refill sent.

## 2024-05-31 ENCOUNTER — Ambulatory Visit: Admitting: Family Medicine

## 2024-05-31 ENCOUNTER — Encounter: Payer: Self-pay | Admitting: Family Medicine

## 2024-05-31 VITALS — BP 120/78 | HR 88 | Temp 98.8°F | Ht 72.0 in | Wt 240.9 lb

## 2024-05-31 DIAGNOSIS — J0140 Acute pansinusitis, unspecified: Secondary | ICD-10-CM

## 2024-05-31 DIAGNOSIS — M1A00X0 Idiopathic chronic gout, unspecified site, without tophus (tophi): Secondary | ICD-10-CM | POA: Insufficient documentation

## 2024-05-31 DIAGNOSIS — N051 Unspecified nephritic syndrome with focal and segmental glomerular lesions: Secondary | ICD-10-CM | POA: Insufficient documentation

## 2024-05-31 DIAGNOSIS — N1831 Chronic kidney disease, stage 3a: Secondary | ICD-10-CM | POA: Diagnosis not present

## 2024-05-31 DIAGNOSIS — I152 Hypertension secondary to endocrine disorders: Secondary | ICD-10-CM | POA: Diagnosis not present

## 2024-05-31 DIAGNOSIS — E0500 Thyrotoxicosis with diffuse goiter without thyrotoxic crisis or storm: Secondary | ICD-10-CM

## 2024-05-31 DIAGNOSIS — E1122 Type 2 diabetes mellitus with diabetic chronic kidney disease: Secondary | ICD-10-CM

## 2024-05-31 DIAGNOSIS — R350 Frequency of micturition: Secondary | ICD-10-CM

## 2024-05-31 DIAGNOSIS — E1169 Type 2 diabetes mellitus with other specified complication: Secondary | ICD-10-CM | POA: Diagnosis not present

## 2024-05-31 DIAGNOSIS — E1159 Type 2 diabetes mellitus with other circulatory complications: Secondary | ICD-10-CM

## 2024-05-31 MED ORDER — AMOXICILLIN 875 MG PO TABS: 875.0000 mg | ORAL_TABLET | Freq: Two times a day (BID) | ORAL | 0 refills | Status: AC

## 2024-05-31 MED ORDER — AMOXICILLIN 875 MG PO TABS: 875.0000 mg | ORAL_TABLET | Freq: Two times a day (BID) | ORAL | 0 refills | Status: DC

## 2024-05-31 NOTE — Progress Notes (Signed)
 Established patient visit   Patient: Scott Holloway   DOB: 07/15/1965   58 y.o. Male  MRN: [id number] Visit Date: 05/31/2024  Today's healthcare provider: Rockie Agent, MD   Chief Complaint  Patient presents with   Medical Management of Chronic Issues    Patient is present for f/u chronic conditions, patient reports doing well. Has some questions regarding allopurinol     Sinusitis    Patient reports sinus congestion, pressure and headache. Some drainage down back of throat x 3 weeks. Taking otc sinus relief and will get better but then starts again. Some productive cough, first week had fever body aches and chills.    Subjective     HPI     Medical Management of Chronic Issues    Additional comments: Patient is present for f/u chronic conditions, patient reports doing well. Has some questions regarding allopurinol          Sinusitis    Additional comments: Patient reports sinus congestion, pressure and headache. Some drainage down back of throat x 3 weeks. Taking otc sinus relief and will get better but then starts again. Some productive cough, first week had fever body aches and chills.       Last edited by Cherry Chiquita HERO, CMA on 05/31/2024  9:14 AM.       Discussed the use of AI scribe software for clinical note transcription with the patient, who gave verbal consent to proceed.  History of Present Illness Scott Holloway is a 58 year old male with chronic diabetes and hypertension who presents with sinus congestion and headache.  He has been experiencing sinus congestion, pressure, headache, and postnasal drainage for the past three weeks. Over-the-counter sinus relief medications provide temporary relief, but symptoms recur. Initially, he had a productive cough, fever, body aches, and chills, which have resolved except for persistent mucus in his throat. He describes frequent sneezing, coughing, and nasal discharge. Claritin  and Flonase nasal spray have  not provided relief.  He has chronic kidney disease stage 3A and was previously referred to nephrology. He recalls a painful kidney biopsy experience in the past and is hesitant about invasive procedures.  He has a history of gout and is not currently taking allopurinol  100 mg daily. Cherry extract has been effective in managing his symptoms, which initially started in the right foot and moved to the left. He has reduced his shellfish intake.  He has type 2 diabetes with a last hemoglobin A1c of 6.4, which was within goal range. He is not taking Zetia  10 mg as prescribed but continues atorvastatin  80 mg daily for hyperlipidemia. He is also on lisinopril  40 mg daily for hypertension and Synthroid  137 mcg daily for Graves' disease.  He reports frequent urination, approximately once every two hours, and wakes up once a night to urinate. No blood in urine. He reports increased urination.     Past Medical History:  Diagnosis Date   Graves disease 11/03/2017   Hyperlipidemia 11/03/2017   Hypertension 11/03/2017   Hypothyroidism 11/03/2017   Pneumothorax 1997   Pneumothorax 07/02/1995    Medications: Outpatient Medications Prior to Visit  Medication Sig   atorvastatin  (LIPITOR) 80 MG tablet Take 1 tablet (80 mg total) by mouth daily.   CALCIUM  PO Take by mouth.   levothyroxine  (SYNTHROID ) 137 MCG tablet TAKE 1 TABLET BY MOUTH BEFORE BREAKFAST   lisinopril  (ZESTRIL ) 40 MG tablet Take 1 tablet by mouth once daily   Multiple Vitamin (MULTIVITAMIN ADULT PO)  Take by mouth.   Omega-3 Fatty Acids (FISH OIL ) 1000 MG CAPS Take 4 capsules (4,000 mg total) by mouth daily at 12 noon.   triamcinolone  cream (KENALOG ) 0.1 % Apply 1 Application topically 2 (two) times daily. (Patient taking differently: Apply 1 Application topically 2 (two) times daily. Taking as needed)   [DISCONTINUED] allopurinol  (ZYLOPRIM ) 100 MG tablet Take 1 tablet (100 mg total) by mouth daily. (Patient not taking: Reported on 05/31/2024)    [DISCONTINUED] amLODipine  (NORVASC ) 5 MG tablet Take 0.5 tablets (2.5 mg total) by mouth daily. Increase to 5 mg as tolerated Consistent BP goal remains <119 and <79 (Patient not taking: Reported on 05/31/2024)   [DISCONTINUED] ezetimibe  (ZETIA ) 10 MG tablet Take 1 tablet (10 mg total) by mouth daily. (Patient not taking: Reported on 05/31/2024)   [DISCONTINUED] loratadine  (CLARITIN ) 10 MG tablet Take 1 tablet (10 mg total) by mouth daily. (Patient not taking: Reported on 05/31/2024)   No facility-administered medications prior to visit.    Review of Systems  Last metabolic panel Lab Results  Component Value Date   GLUCOSE 85 10/31/2023   NA 141 10/31/2023   K 4.1 10/31/2023   CL 101 10/31/2023   CO2 24 10/31/2023   BUN 16 10/31/2023   CREATININE 1.61 (H) 10/31/2023   EGFR 49 (L) 10/31/2023   CALCIUM  9.0 10/31/2023   PHOS 4.4 (H) 01/14/2019   PROT 6.5 10/31/2023   ALBUMIN 3.6 (L) 10/31/2023   LABGLOB 2.9 10/31/2023   AGRATIO 1.4 10/07/2022   BILITOT 0.3 10/31/2023   ALKPHOS 77 10/31/2023   AST 21 10/31/2023   ALT 19 10/31/2023   ANIONGAP 9 10/22/2023   Last lipids Lab Results  Component Value Date   CHOL 189 04/08/2023   HDL 47 04/08/2023   LDLCALC 114 (H) 04/08/2023   TRIG 157 (H) 04/08/2023   CHOLHDL 4.0 04/08/2023   Last hemoglobin A1c Lab Results  Component Value Date   HGBA1C 6.4 (A) 10/31/2023   Lab Results  Component Value Date   PSA1 3.5 04/08/2023   PSA1 3.2 05/13/2022   PSA1 3.6 04/09/2021          Objective    BP 120/78 (Cuff Size: Large)   Pulse 88   Temp 98.8 F (37.1 C) (Oral)   Ht 6' (1.829 m)   Wt 240 lb 14.4 oz (109.3 kg)   SpO2 99%   BMI 32.67 kg/m   BP Readings from Last 3 Encounters:  05/31/24 120/78  02/25/24 138/85  10/31/23 (!) 148/86   Wt Readings from Last 3 Encounters:  05/31/24 240 lb 14.4 oz (109.3 kg)  02/25/24 236 lb 6.4 oz (107.2 kg)  10/31/23 233 lb (105.7 kg)        Physical Exam  Physical Exam VITALS:  BP- 141/83 General: Alert, no acute distress Cardio: RRR, no r/m/g Pulm: CTAB, normal work of breathing     No results found for any visits on 05/31/24.  Assessment & Plan     Problem List Items Addressed This Visit     Chronic idiopathic gout   Chronic  Gout flare previously managed with cherries and tart cherry extract. No current pain. He prefers natural remedies over medication. - Continue tart cherry extract as needed - Monitor uric acid levels - Will consider reintroducing allopurinol  if uric acid levels are elevated      Relevant Orders   Uric acid   Focal segmental glomerulosclerosis   Chronic kidney disease, stage 3A with focal segmental glomerulosclerosis Previous  kidney biopsy in 2022. He is hesitant about invasive procedures due to past negative experiences. Prefers non-invasive monitoring. - Referred to nephrology for further management - Ordered basic metabolic panel to monitor kidney function      Graves disease   Chronic  Currently managed with Synthroid  137 mcg daily. - Continue Synthroid  137 mcg daily      Hyperlipidemia associated with type 2 diabetes mellitus (HCC)    Hyperlipidemia associated with diabetes Chronic  Currently managed with atorvastatin  80 mg daily. Not taking Zetia  as prescribed. He is taking over-the-counter calcium  supplements. - Continue atorvastatin  80 mg daily - Ordered lipid panel to assess cholesterol levels      Relevant Orders   Lipid panel   Hypertension associated with diabetes (HCC)   Chronic  Initially elevated, normal range on recheck  Continue lisinopril  40mg  daily  Measure CMP today       Relevant Orders   Comprehensive metabolic panel with GFR   Type 2 diabetes mellitus with diabetic chronic kidney disease (HCC) - Primary   Chronic   Last hemoglobin A1c was 6.4%, within goal range. 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 Previous nephrology referral was not followed up. He  is not taking allopurinol  due to preference for natural remedies. - Ordered basic metabolic panel to assess kidney function - Updated hemoglobin A1c - Continue lisinopril  40 mg daily - Referred to nephrology for further management of CKD - Encouraged home blood pressure monitoring      Relevant Orders   HgB A1c   Other Visit Diagnoses       Acute non-recurrent pansinusitis       Relevant Medications   amoxicillin  (AMOXIL ) 875 MG tablet     Urinary frequency       Relevant Orders   PSA Total (Reflex To Free)       Assessment and Plan Assessment & Plan    Acute sinusitis with upper respiratory symptoms Symptoms include sinus congestion, pressure, headache, and post-nasal drainage for three weeks. Previous over-the-counter sinus relief provided temporary relief. No fever or tenderness. Claritin  was ineffective. He prefers antibiotics for resolution. - Prescribed amoxicillin  875 mg twice daily for 7 days - Discussed potential side effects of antibiotics, including resistance and gut flora disruption    Frequency of micturition Reports frequent urination, approximately every two hours, with nocturia once per night. No hematuria reported. - Ordered PSA level to assess prostate health     Return in about 3 months (around 08/29/2024) for CKD, HTN .         Rockie Agent, MD  [facility] [phone] (phone) [phone] (fax)  [facility]

## 2024-05-31 NOTE — Patient Instructions (Signed)
 [address], [place] [address] [place], [place] [address] [phone]   To keep you healthy, please keep in mind the following health maintenance items that you are due for:   Health Maintenance Due  Topic Date Due   OPHTHALMOLOGY EXAM  Never done   HEMOGLOBIN A1C  05/02/2024     Best Wishes,   Dr. Lang

## 2024-05-31 NOTE — Assessment & Plan Note (Signed)
 Chronic  Currently managed with Synthroid  137 mcg daily. - Continue Synthroid  137 mcg daily

## 2024-05-31 NOTE — Assessment & Plan Note (Signed)
  Hyperlipidemia associated with diabetes Chronic  Currently managed with atorvastatin  80 mg daily. Not taking Zetia  as prescribed. He is taking over-the-counter calcium  supplements. - Continue atorvastatin  80 mg daily - Ordered lipid panel to assess cholesterol levels

## 2024-05-31 NOTE — Assessment & Plan Note (Signed)
 Chronic   Last hemoglobin A1c was 6.4%, within goal range. 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 Previous nephrology referral was not followed up. He is not taking allopurinol  due to preference for natural remedies. - Ordered basic metabolic panel to assess kidney function - Updated hemoglobin A1c - Continue lisinopril  40 mg daily - Referred to nephrology for further management of CKD - Encouraged home blood pressure monitoring

## 2024-05-31 NOTE — Assessment & Plan Note (Signed)
 Chronic  Initially elevated, normal range on recheck  Continue lisinopril  40mg  daily  Measure CMP today

## 2024-05-31 NOTE — Assessment & Plan Note (Signed)
 Chronic  Gout flare previously managed with cherries and tart cherry extract. No current pain. He prefers natural remedies over medication. - Continue tart cherry extract as needed - Monitor uric acid levels - Will consider reintroducing allopurinol  if uric acid levels are elevated

## 2024-05-31 NOTE — Assessment & Plan Note (Signed)
 Chronic kidney disease, stage 3A with focal segmental glomerulosclerosis Previous kidney biopsy in 2022. He is hesitant about invasive procedures due to past negative experiences. Prefers non-invasive monitoring. - Referred to nephrology for further management - Ordered basic metabolic panel to monitor kidney function

## 2024-06-01 LAB — LIPID PANEL
Chol/HDL Ratio: 4.6 ratio (ref 0.0–5.0)
Cholesterol, Total: 205 mg/dL — ABNORMAL HIGH (ref 100–199)
HDL: 45 mg/dL (ref >39)
LDL Chol Calc (NIH): 116 mg/dL — ABNORMAL HIGH (ref 0–99)
Triglycerides: 253 mg/dL — ABNORMAL HIGH (ref 0–149)
VLDL Cholesterol Cal: 44 mg/dL — ABNORMAL HIGH (ref 5–40)

## 2024-06-01 LAB — COMPREHENSIVE METABOLIC PANEL WITH GFR
ALT: 15 IU/L (ref 0–44)
AST: 24 IU/L (ref 0–40)
Albumin: 3.9 g/dL (ref 3.8–4.9)
Alkaline Phosphatase: 66 IU/L (ref 47–123)
BUN/Creatinine Ratio: 14 (ref 10–24)
BUN: 27 mg/dL (ref 8–27)
Bilirubin Total: 0.2 mg/dL (ref 0.0–1.2)
CO2: 24 mmol/L (ref 20–29)
Calcium: 9.4 mg/dL (ref 8.6–10.2)
Chloride: 105 mmol/L (ref 96–106)
Creatinine, Ser: 1.99 mg/dL — ABNORMAL HIGH (ref 0.76–1.27)
Globulin, Total: 2.6 g/dL (ref 1.5–4.5)
Glucose: 104 mg/dL — ABNORMAL HIGH (ref 70–99)
Potassium: 4.1 mmol/L (ref 3.5–5.2)
Sodium: 139 mmol/L (ref 134–144)
Total Protein: 6.5 g/dL (ref 6.0–8.5)
eGFR: 38 mL/min/1.73 — ABNORMAL LOW (ref >59)

## 2024-06-01 LAB — PSA TOTAL (REFLEX TO FREE): Prostate Specific Ag, Serum: 4.2 ng/mL — ABNORMAL HIGH (ref 0.0–4.0)

## 2024-06-01 LAB — URIC ACID: Uric Acid: 9.9 mg/dL — ABNORMAL HIGH (ref 3.8–8.4)

## 2024-06-01 LAB — HEMOGLOBIN A1C
Est. average glucose Bld gHb Est-mCnc: 137 mg/dL
Hgb A1c MFr Bld: 6.4 % — ABNORMAL HIGH (ref 4.8–5.6)

## 2024-06-01 LAB — FPSA% REFLEX
% FREE PSA: 26.9 %
PSA, FREE: 1.13 ng/mL

## 2024-06-07 ENCOUNTER — Ambulatory Visit: Payer: Self-pay | Admitting: Family Medicine

## 2024-06-08 NOTE — Progress Notes (Signed)
 Attempted to reach out to patient a recording came on and stated that memory was full.  Will try to call again another time.

## 2024-06-08 NOTE — Progress Notes (Signed)
 Printed letter to send to home address.

## 2024-08-01 ENCOUNTER — Other Ambulatory Visit: Payer: Self-pay | Admitting: Family Medicine

## 2024-08-01 DIAGNOSIS — N1831 Chronic kidney disease, stage 3a: Secondary | ICD-10-CM

## 2024-08-01 DIAGNOSIS — E890 Postprocedural hypothyroidism: Secondary | ICD-10-CM

## 2024-08-01 DIAGNOSIS — E118 Type 2 diabetes mellitus with unspecified complications: Secondary | ICD-10-CM

## 2024-08-01 DIAGNOSIS — E1159 Type 2 diabetes mellitus with other circulatory complications: Secondary | ICD-10-CM

## 2024-08-31 ENCOUNTER — Ambulatory Visit: Admitting: Family Medicine
